# Patient Record
Sex: Male | Born: 1950 | Race: White | Hispanic: No | Marital: Married | Smoking: Never smoker
Health system: Southern US, Community
[De-identification: ages and names within clinical notes are randomized; demographics above are authoritative.]

## PROBLEM LIST (undated history)

## (undated) DIAGNOSIS — M069 Rheumatoid arthritis, unspecified: Secondary | ICD-10-CM

## (undated) DIAGNOSIS — M1990 Unspecified osteoarthritis, unspecified site: Secondary | ICD-10-CM

## (undated) DIAGNOSIS — M109 Gout, unspecified: Secondary | ICD-10-CM

## (undated) DIAGNOSIS — E119 Type 2 diabetes mellitus without complications: Secondary | ICD-10-CM

## (undated) DIAGNOSIS — Z8669 Personal history of other diseases of the nervous system and sense organs: Secondary | ICD-10-CM

## (undated) DIAGNOSIS — I10 Essential (primary) hypertension: Secondary | ICD-10-CM

## (undated) HISTORY — DX: Gout, unspecified: M10.9

## (undated) HISTORY — DX: Type 2 diabetes mellitus without complications: E11.9

## (undated) HISTORY — PX: THORACOSCOPY W/ THORACIC SYMPATHECTOMY: SUR1348

## (undated) HISTORY — DX: Rheumatoid arthritis, unspecified: M06.9

## (undated) HISTORY — DX: Personal history of other diseases of the nervous system and sense organs: Z86.69

## (undated) HISTORY — PX: HERNIA REPAIR: SHX51

---

## 1975-07-25 HISTORY — PX: WRIST SURGERY: SHX841

## 2002-07-24 DIAGNOSIS — Z8669 Personal history of other diseases of the nervous system and sense organs: Secondary | ICD-10-CM

## 2002-07-24 HISTORY — PX: OTHER SURGICAL HISTORY: SHX169

## 2002-07-24 HISTORY — DX: Personal history of other diseases of the nervous system and sense organs: Z86.69

## 2008-10-22 ENCOUNTER — Encounter: Admission: RE | Admit: 2008-10-22 | Discharge: 2008-10-22 | Payer: Self-pay | Admitting: Internal Medicine

## 2014-06-03 DIAGNOSIS — M0589 Other rheumatoid arthritis with rheumatoid factor of multiple sites: Secondary | ICD-10-CM | POA: Diagnosis not present

## 2014-06-15 DIAGNOSIS — M0589 Other rheumatoid arthritis with rheumatoid factor of multiple sites: Secondary | ICD-10-CM | POA: Diagnosis not present

## 2014-07-30 DIAGNOSIS — M0589 Other rheumatoid arthritis with rheumatoid factor of multiple sites: Secondary | ICD-10-CM | POA: Diagnosis not present

## 2014-08-18 DIAGNOSIS — M5136 Other intervertebral disc degeneration, lumbar region: Secondary | ICD-10-CM | POA: Diagnosis not present

## 2014-08-18 DIAGNOSIS — M0589 Other rheumatoid arthritis with rheumatoid factor of multiple sites: Secondary | ICD-10-CM | POA: Diagnosis not present

## 2014-08-18 DIAGNOSIS — M1A09X0 Idiopathic chronic gout, multiple sites, without tophus (tophi): Secondary | ICD-10-CM | POA: Diagnosis not present

## 2014-09-21 DIAGNOSIS — R972 Elevated prostate specific antigen [PSA]: Secondary | ICD-10-CM | POA: Diagnosis not present

## 2014-09-21 DIAGNOSIS — I10 Essential (primary) hypertension: Secondary | ICD-10-CM | POA: Diagnosis not present

## 2014-09-21 DIAGNOSIS — R739 Hyperglycemia, unspecified: Secondary | ICD-10-CM | POA: Diagnosis not present

## 2014-09-21 DIAGNOSIS — G44219 Episodic tension-type headache, not intractable: Secondary | ICD-10-CM | POA: Diagnosis not present

## 2014-09-24 DIAGNOSIS — M0589 Other rheumatoid arthritis with rheumatoid factor of multiple sites: Secondary | ICD-10-CM | POA: Diagnosis not present

## 2014-11-19 DIAGNOSIS — M0589 Other rheumatoid arthritis with rheumatoid factor of multiple sites: Secondary | ICD-10-CM | POA: Diagnosis not present

## 2014-12-15 DIAGNOSIS — N401 Enlarged prostate with lower urinary tract symptoms: Secondary | ICD-10-CM | POA: Diagnosis not present

## 2014-12-15 DIAGNOSIS — R972 Elevated prostate specific antigen [PSA]: Secondary | ICD-10-CM | POA: Diagnosis not present

## 2014-12-15 DIAGNOSIS — R351 Nocturia: Secondary | ICD-10-CM | POA: Diagnosis not present

## 2014-12-15 DIAGNOSIS — N138 Other obstructive and reflux uropathy: Secondary | ICD-10-CM | POA: Diagnosis not present

## 2014-12-17 DIAGNOSIS — M0589 Other rheumatoid arthritis with rheumatoid factor of multiple sites: Secondary | ICD-10-CM | POA: Diagnosis not present

## 2014-12-17 DIAGNOSIS — M1A09X0 Idiopathic chronic gout, multiple sites, without tophus (tophi): Secondary | ICD-10-CM | POA: Diagnosis not present

## 2014-12-17 DIAGNOSIS — Z79899 Other long term (current) drug therapy: Secondary | ICD-10-CM | POA: Diagnosis not present

## 2014-12-17 DIAGNOSIS — M5136 Other intervertebral disc degeneration, lumbar region: Secondary | ICD-10-CM | POA: Diagnosis not present

## 2015-01-14 DIAGNOSIS — M0589 Other rheumatoid arthritis with rheumatoid factor of multiple sites: Secondary | ICD-10-CM | POA: Diagnosis not present

## 2015-03-11 DIAGNOSIS — I10 Essential (primary) hypertension: Secondary | ICD-10-CM | POA: Diagnosis not present

## 2015-03-11 DIAGNOSIS — Z125 Encounter for screening for malignant neoplasm of prostate: Secondary | ICD-10-CM | POA: Diagnosis not present

## 2015-03-11 DIAGNOSIS — M0589 Other rheumatoid arthritis with rheumatoid factor of multiple sites: Secondary | ICD-10-CM | POA: Diagnosis not present

## 2015-03-16 DIAGNOSIS — M25562 Pain in left knee: Secondary | ICD-10-CM | POA: Diagnosis not present

## 2015-03-16 DIAGNOSIS — M8560 Other cyst of bone, unspecified site: Secondary | ICD-10-CM | POA: Diagnosis not present

## 2015-03-23 DIAGNOSIS — M5136 Other intervertebral disc degeneration, lumbar region: Secondary | ICD-10-CM | POA: Diagnosis not present

## 2015-03-23 DIAGNOSIS — Z79899 Other long term (current) drug therapy: Secondary | ICD-10-CM | POA: Diagnosis not present

## 2015-03-23 DIAGNOSIS — M150 Primary generalized (osteo)arthritis: Secondary | ICD-10-CM | POA: Diagnosis not present

## 2015-03-23 DIAGNOSIS — M0589 Other rheumatoid arthritis with rheumatoid factor of multiple sites: Secondary | ICD-10-CM | POA: Diagnosis not present

## 2015-03-23 DIAGNOSIS — M1A09X0 Idiopathic chronic gout, multiple sites, without tophus (tophi): Secondary | ICD-10-CM | POA: Diagnosis not present

## 2015-03-31 DIAGNOSIS — R972 Elevated prostate specific antigen [PSA]: Secondary | ICD-10-CM | POA: Diagnosis not present

## 2015-03-31 DIAGNOSIS — Z0000 Encounter for general adult medical examination without abnormal findings: Secondary | ICD-10-CM | POA: Diagnosis not present

## 2015-03-31 DIAGNOSIS — Z23 Encounter for immunization: Secondary | ICD-10-CM | POA: Diagnosis not present

## 2015-03-31 DIAGNOSIS — I10 Essential (primary) hypertension: Secondary | ICD-10-CM | POA: Diagnosis not present

## 2015-03-31 DIAGNOSIS — R7309 Other abnormal glucose: Secondary | ICD-10-CM | POA: Diagnosis not present

## 2015-04-06 DIAGNOSIS — M1712 Unilateral primary osteoarthritis, left knee: Secondary | ICD-10-CM | POA: Diagnosis not present

## 2015-05-06 DIAGNOSIS — M0589 Other rheumatoid arthritis with rheumatoid factor of multiple sites: Secondary | ICD-10-CM | POA: Diagnosis not present

## 2015-06-23 DIAGNOSIS — M150 Primary generalized (osteo)arthritis: Secondary | ICD-10-CM | POA: Diagnosis not present

## 2015-06-23 DIAGNOSIS — M5136 Other intervertebral disc degeneration, lumbar region: Secondary | ICD-10-CM | POA: Diagnosis not present

## 2015-06-23 DIAGNOSIS — M1A09X0 Idiopathic chronic gout, multiple sites, without tophus (tophi): Secondary | ICD-10-CM | POA: Diagnosis not present

## 2015-06-23 DIAGNOSIS — M0589 Other rheumatoid arthritis with rheumatoid factor of multiple sites: Secondary | ICD-10-CM | POA: Diagnosis not present

## 2015-06-23 DIAGNOSIS — Z79899 Other long term (current) drug therapy: Secondary | ICD-10-CM | POA: Diagnosis not present

## 2015-07-01 DIAGNOSIS — M0589 Other rheumatoid arthritis with rheumatoid factor of multiple sites: Secondary | ICD-10-CM | POA: Diagnosis not present

## 2015-07-07 DIAGNOSIS — M25561 Pain in right knee: Secondary | ICD-10-CM | POA: Diagnosis not present

## 2015-07-07 DIAGNOSIS — D219 Benign neoplasm of connective and other soft tissue, unspecified: Secondary | ICD-10-CM | POA: Diagnosis not present

## 2015-07-12 DIAGNOSIS — Z79899 Other long term (current) drug therapy: Secondary | ICD-10-CM | POA: Diagnosis not present

## 2015-07-12 DIAGNOSIS — M5136 Other intervertebral disc degeneration, lumbar region: Secondary | ICD-10-CM | POA: Diagnosis not present

## 2015-07-12 DIAGNOSIS — M150 Primary generalized (osteo)arthritis: Secondary | ICD-10-CM | POA: Diagnosis not present

## 2015-07-12 DIAGNOSIS — M1A09X0 Idiopathic chronic gout, multiple sites, without tophus (tophi): Secondary | ICD-10-CM | POA: Diagnosis not present

## 2015-07-12 DIAGNOSIS — M0589 Other rheumatoid arthritis with rheumatoid factor of multiple sites: Secondary | ICD-10-CM | POA: Diagnosis not present

## 2015-07-19 ENCOUNTER — Emergency Department (HOSPITAL_COMMUNITY): Payer: Medicare Other

## 2015-07-19 ENCOUNTER — Emergency Department (HOSPITAL_COMMUNITY)
Admission: EM | Admit: 2015-07-19 | Discharge: 2015-07-19 | Disposition: A | Discharge status: Home / Self Care | Payer: Medicare Other | Attending: Emergency Medicine | Admitting: Emergency Medicine

## 2015-07-19 ENCOUNTER — Encounter (HOSPITAL_COMMUNITY): Payer: Self-pay | Admitting: *Deleted

## 2015-07-19 DIAGNOSIS — R590 Localized enlarged lymph nodes: Secondary | ICD-10-CM

## 2015-07-19 DIAGNOSIS — A154 Tuberculosis of intrathoracic lymph nodes: Secondary | ICD-10-CM | POA: Insufficient documentation

## 2015-07-19 DIAGNOSIS — T7840XA Allergy, unspecified, initial encounter: Secondary | ICD-10-CM | POA: Diagnosis not present

## 2015-07-19 DIAGNOSIS — I10 Essential (primary) hypertension: Secondary | ICD-10-CM | POA: Diagnosis not present

## 2015-07-19 DIAGNOSIS — R599 Enlarged lymph nodes, unspecified: Secondary | ICD-10-CM | POA: Diagnosis not present

## 2015-07-19 DIAGNOSIS — M1990 Unspecified osteoarthritis, unspecified site: Secondary | ICD-10-CM | POA: Diagnosis not present

## 2015-07-19 DIAGNOSIS — R079 Chest pain, unspecified: Secondary | ICD-10-CM | POA: Diagnosis not present

## 2015-07-19 HISTORY — DX: Unspecified osteoarthritis, unspecified site: M19.90

## 2015-07-19 HISTORY — DX: Essential (primary) hypertension: I10

## 2015-07-19 LAB — CBC WITH DIFFERENTIAL/PLATELET
BASOS PCT: 0 %
Basophils Absolute: 0 10*3/uL (ref 0.0–0.1)
Eosinophils Absolute: 0.1 10*3/uL (ref 0.0–0.7)
Eosinophils Relative: 1 %
HEMATOCRIT: 43.7 % (ref 39.0–52.0)
Hemoglobin: 14.7 g/dL (ref 13.0–17.0)
LYMPHS ABS: 2.2 10*3/uL (ref 0.7–4.0)
LYMPHS PCT: 27 %
MCH: 30.2 pg (ref 26.0–34.0)
MCHC: 33.6 g/dL (ref 30.0–36.0)
MCV: 89.7 fL (ref 78.0–100.0)
MONO ABS: 1 10*3/uL (ref 0.1–1.0)
MONOS PCT: 13 %
NEUTROS ABS: 4.7 10*3/uL (ref 1.7–7.7)
Neutrophils Relative %: 59 %
Platelets: 110 10*3/uL — ABNORMAL LOW (ref 150–400)
RBC: 4.87 MIL/uL (ref 4.22–5.81)
RDW: 14.5 % (ref 11.5–15.5)
WBC: 8 10*3/uL (ref 4.0–10.5)

## 2015-07-19 LAB — BASIC METABOLIC PANEL
ANION GAP: 9 (ref 5–15)
BUN: 17 mg/dL (ref 6–20)
CALCIUM: 9 mg/dL (ref 8.9–10.3)
CO2: 25 mmol/L (ref 22–32)
Chloride: 105 mmol/L (ref 101–111)
Creatinine, Ser: 0.98 mg/dL (ref 0.61–1.24)
GFR calc Af Amer: >60 mL/min (ref >60)
GFR calc non Af Amer: >60 mL/min (ref >60)
GLUCOSE: 153 mg/dL — AB (ref 65–99)
Potassium: 3.7 mmol/L (ref 3.5–5.1)
Sodium: 139 mmol/L (ref 135–145)

## 2015-07-19 LAB — TROPONIN I: Troponin I: <0.03 ng/mL (ref <0.031)

## 2015-07-19 MED ORDER — DIPHENHYDRAMINE HCL 25 MG PO CAPS
25.0000 mg | ORAL_CAPSULE | Freq: Once | ORAL | Status: AC
  Administered 2015-07-19: 25 mg via ORAL
  Filled 2015-07-19: qty 1

## 2015-07-19 MED ORDER — DIPHENHYDRAMINE HCL 50 MG/ML IJ SOLN: 25.0000 mg | Freq: Once | INTRAMUSCULAR | Status: DC

## 2015-07-19 NOTE — ED Notes (Signed)
Patient has returned from being out of the department; patient placed back on monitor, continuous pulse oximetry and blood pressure cuff; visitor at bedside 

## 2015-07-19 NOTE — ED Notes (Signed)
Patient d/c'd from monitor, continuous pulse oximetry and blood pressure cuff; patient getting dressed to be discharged home; visitor at bedside 

## 2015-07-19 NOTE — ED Notes (Signed)
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 Pt states that that he woke this morning with swelling to his lips and rash to his back. Pt able to speak in full sentences. Reports "congestion" in his throat as well.

## 2015-07-19 NOTE — Discharge Instructions (Signed)
Return as discussed for problems breathing or swallowing. Call the ambulance as discussed.you need to have a repeat x-ray in the 2 weeks as discussed. Continue to take benadryl as discussed  Allergies An allergy is when your body reacts to a substance in a way that is not normal. An allergic reaction can happen after you:  Eat something.  Breathe in something.  Touch something. WHAT KINDS OF ALLERGIES ARE THERE? You can be allergic to:  Things that are only around during certain seasons, like molds and pollens.  Foods.  Drugs.  Insects.  Animal dander. WHAT ARE SYMPTOMS OF ALLERGIES?  Puffiness (swelling). This may happen on the lips, face, tongue, mouth, or throat.  Sneezing.  Coughing.  Breathing loudly (wheezing).  Stuffy nose.  Tingling in the mouth.  A rash.  Itching.  Itchy, red, puffy areas of skin (hives).  Watery eyes.  Throwing up (vomiting).  Watery poop (diarrhea).  Dizziness.  Feeling faint or fainting.  Trouble breathing or swallowing.  A tight feeling in the chest.  A fast heartbeat. HOW ARE ALLERGIES DIAGNOSED? Allergies can be diagnosed with:  A medical and family history.  Skin tests.  Blood tests.  A food diary. A food diary is a record of all the foods, drinks, and symptoms you have each day.  The results of an elimination diet. This diet involves making sure not to eat certain foods and then seeing what happens when you start eating them again. HOW ARE ALLERGIES TREATED? There is no cure for allergies, but allergic reactions can be treated with medicine. Severe reactions usually need to be treated at a hospital.  HOW CAN REACTIONS BE PREVENTED? The best way to prevent an allergic reaction is to avoid the thing you are allergic to. Allergy shots and medicines can also help prevent reactions in some cases.   This information is not intended to replace advice given to you by your health care provider. Make sure you discuss  any questions you have with your health care provider.   Document Released: 11/04/2012 Document Revised: 07/31/2014 Document Reviewed: 04/21/2014 Elsevier Interactive Patient Education 2016 [employer] Lymphadenopathy Lymphadenopathy refers to swollen or enlarged lymph glands, also called lymph nodes. Lymph glands are part of your body's defense (immune) system, which protects the body from infections, germs, and diseases. Lymph glands are found in many locations in your body, including the neck, underarm, and groin.  Many things can cause lymph glands to become enlarged. When your immune system responds to germs, such as viruses or bacteria, infection-fighting cells and fluid build up. This causes the glands to grow in size. Usually, this is not something to worry about. The swelling and any soreness often go away without treatment. However, swollen lymph glands can also be caused by a number of diseases. Your health care provider may do various tests to help determine the cause. If the cause of your swollen lymph glands cannot be found, it is important to monitor your condition to make sure the swelling goes away. HOME CARE INSTRUCTIONS Watch your condition for any changes. The following actions may help to lessen any discomfort you are feeling:  Get plenty of rest.  Take medicines only as directed by your health care provider. Your health care provider may recommend over-the-counter medicines for pain.  Apply moist heat compresses to the site of swollen lymph nodes as directed by your health care provider. This can help reduce any pain.  Check your lymph nodes daily for any changes.  Keep all follow-up visits as directed by your health care provider. This is important. SEEK MEDICAL CARE IF:  Your lymph nodes are still swollen after 2 weeks.  Your swelling increases or spreads to other areas.  Your lymph nodes are hard, seem fixed to the skin, or are growing rapidly.  Your skin over  the lymph nodes is red and inflamed.  You have a fever.  You have chills.  You have fatigue.  You develop a sore throat.  You have abdominal pain.  You have weight loss.  You have night sweats. SEEK IMMEDIATE MEDICAL CARE IF:  You notice fluid leaking from the area of the enlarged lymph node.  You have severe pain in any area of your body.  You have chest pain.  You have shortness of breath.   This information is not intended to replace advice given to you by your health care provider. Make sure you discuss any questions you have with your health care provider.   Document Released: 04/18/2008 Document Revised: 07/31/2014 Document Reviewed: 02/12/2014 Elsevier Interactive Patient Education [employer].

## 2015-07-19 NOTE — ED Notes (Signed)
MD at bedside. 

## 2015-07-19 NOTE — ED Provider Notes (Signed)
CSN: [id number]     Arrival date & time 07/19/15  [id number] History   First MD Initiated Contact with Patient 07/19/15 [phone]     Chief Complaint  Patient presents with  . Allergic Reaction     (Consider location/radiation/quality/duration/timing/severity/associated sxs/prior Treatment) HPI Comments: Pt comes in with c/o allergic reaction. He states that he had remicade in October and has been having intermittent hives since. She states that he woke up this morning with hives on his back and his left lower lip was swollen and he felt congested in the back of his throat. He denies taking anything for th symptoms and the hives and facial swelling have resolved on its own. He state that he is on a prednisone pack for the symptoms that he has been having. He states that he is having left sided chest pain and some nausea with the symptoms. Denies sob. He had been taking benadryl but stopped 2 days ago because the itching had gotten a lot better  The history is provided by the patient. No language interpreter was used.    Past Medical History  Diagnosis Date  . Arthritis   . Hypertension    History reviewed. No pertinent past surgical history. No family history on file. Social History  Substance Use Topics  . Smoking status: Never Smoker   . Smokeless tobacco: None  . Alcohol Use: None    Review of Systems  All other systems reviewed and are negative.     Allergies  Review of patient's allergies indicates no known allergies.  Home Medications   Prior to Admission medications   Not on File   BP 158/90 mmHg  Pulse 70  Temp(Src) 97.7 F (36.5 C) (Oral)  Resp 16  SpO2 98% Physical Exam  Constitutional: He is oriented to person, place, and time. He appears well-developed and well-nourished.  HENT:  Head: Normocephalic.  Mouth/Throat: Oropharynx is clear and moist.  No facial swelling noted. No oral mucosal swelling  Eyes: Conjunctivae are normal. Pupils are equal, round, and  reactive to light.  Cardiovascular: Normal rate and regular rhythm.   Pulmonary/Chest: Effort normal and breath sounds normal.  Musculoskeletal: Normal range of motion.  Neurological: He is alert and oriented to person, place, and time. He exhibits normal muscle tone. Coordination normal.  Skin: Skin is warm and dry. No rash noted.  Psychiatric: He has a normal mood and affect.  Nursing note and vitals reviewed.   ED Course  Procedures (including critical care time) Labs Review Labs Reviewed  BASIC METABOLIC PANEL - Abnormal; Notable for the following:    Glucose, Bld 153 (*)    All other components within normal limits  CBC WITH DIFFERENTIAL/PLATELET - Abnormal; Notable for the following:    Platelets 110 (*)    All other components within normal limits  TROPONIN I    Imaging Review Dg Chest 2 View  07/19/2015  CLINICAL DATA:  Chest pain EXAM: CHEST - 2 VIEW COMPARISON:  None. FINDINGS: The heart size is normal. There is mild prominence of the hila which could represent adenopathy. There is no edema or effusion. No focal airspace disease is evident. The visualized soft tissues and bony thorax are unremarkable. IMPRESSION: 1. Mild hilar prominence, potentially adenopathy. 2. Otherwise no acute cardiopulmonary disease. Electronically Signed   By: San Morelle M.D.   On: 07/19/2015 09:39   I have personally reviewed and evaluated these images and lab results as part of my medical decision-making.   EKG Interpretation  Date/Time:  Monday July 19 2015 08:29:55 EST Ventricular Rate:  55 PR Interval:  137 QRS Duration: 101 QT Interval:  410 QTC Calculation: 392 R Axis:   10 Text Interpretation:  Sinus rhythm No previous tracing `no acute st/t  changes Confirmed by Ashok Cordia  MD, Lennette Bihari ([id number]) on 07/19/2015 10:20:10 AM      MDM   Final diagnoses:  Allergic reaction, initial encounter  Adenopathy, hilar    9:20 AM Pharmacy clarified that pts infusion was 12/8  not in october  10:34 AM Pt continues to not have any oral mucosal swelling or respiratory distress. Discussed strict return precaution including calling ems for oral involvement. Pt is on steroids already. Discussed follow up with his pcp for the x-ray  Glendell Docker, NP 07/19/15 [place], MD 07/19/15 1220

## 2015-07-21 DIAGNOSIS — M0589 Other rheumatoid arthritis with rheumatoid factor of multiple sites: Secondary | ICD-10-CM | POA: Diagnosis not present

## 2015-07-21 DIAGNOSIS — M1A09X0 Idiopathic chronic gout, multiple sites, without tophus (tophi): Secondary | ICD-10-CM | POA: Diagnosis not present

## 2015-07-21 DIAGNOSIS — Z79899 Other long term (current) drug therapy: Secondary | ICD-10-CM | POA: Diagnosis not present

## 2015-07-21 DIAGNOSIS — K219 Gastro-esophageal reflux disease without esophagitis: Secondary | ICD-10-CM | POA: Diagnosis not present

## 2015-07-21 DIAGNOSIS — M150 Primary generalized (osteo)arthritis: Secondary | ICD-10-CM | POA: Diagnosis not present

## 2015-07-21 DIAGNOSIS — L509 Urticaria, unspecified: Secondary | ICD-10-CM | POA: Diagnosis not present

## 2015-07-21 DIAGNOSIS — M5136 Other intervertebral disc degeneration, lumbar region: Secondary | ICD-10-CM | POA: Diagnosis not present

## 2015-08-04 DIAGNOSIS — M5136 Other intervertebral disc degeneration, lumbar region: Secondary | ICD-10-CM | POA: Diagnosis not present

## 2015-08-04 DIAGNOSIS — L509 Urticaria, unspecified: Secondary | ICD-10-CM | POA: Diagnosis not present

## 2015-08-04 DIAGNOSIS — Z79899 Other long term (current) drug therapy: Secondary | ICD-10-CM | POA: Diagnosis not present

## 2015-08-04 DIAGNOSIS — M1A09X0 Idiopathic chronic gout, multiple sites, without tophus (tophi): Secondary | ICD-10-CM | POA: Diagnosis not present

## 2015-08-04 DIAGNOSIS — M0589 Other rheumatoid arthritis with rheumatoid factor of multiple sites: Secondary | ICD-10-CM | POA: Diagnosis not present

## 2015-08-04 DIAGNOSIS — K219 Gastro-esophageal reflux disease without esophagitis: Secondary | ICD-10-CM | POA: Diagnosis not present

## 2015-08-04 DIAGNOSIS — M150 Primary generalized (osteo)arthritis: Secondary | ICD-10-CM | POA: Diagnosis not present

## 2015-08-12 DIAGNOSIS — M0589 Other rheumatoid arthritis with rheumatoid factor of multiple sites: Secondary | ICD-10-CM | POA: Diagnosis not present

## 2015-08-19 DIAGNOSIS — M1712 Unilateral primary osteoarthritis, left knee: Secondary | ICD-10-CM | POA: Diagnosis not present

## 2015-09-09 DIAGNOSIS — M0589 Other rheumatoid arthritis with rheumatoid factor of multiple sites: Secondary | ICD-10-CM | POA: Diagnosis not present

## 2015-09-17 DIAGNOSIS — M25551 Pain in right hip: Secondary | ICD-10-CM | POA: Diagnosis not present

## 2015-09-23 DIAGNOSIS — H2513 Age-related nuclear cataract, bilateral: Secondary | ICD-10-CM | POA: Diagnosis not present

## 2015-09-30 DIAGNOSIS — M0589 Other rheumatoid arthritis with rheumatoid factor of multiple sites: Secondary | ICD-10-CM | POA: Diagnosis not present

## 2015-09-30 DIAGNOSIS — M150 Primary generalized (osteo)arthritis: Secondary | ICD-10-CM | POA: Diagnosis not present

## 2015-09-30 DIAGNOSIS — M5136 Other intervertebral disc degeneration, lumbar region: Secondary | ICD-10-CM | POA: Diagnosis not present

## 2015-09-30 DIAGNOSIS — Z79899 Other long term (current) drug therapy: Secondary | ICD-10-CM | POA: Diagnosis not present

## 2015-09-30 DIAGNOSIS — M1A09X0 Idiopathic chronic gout, multiple sites, without tophus (tophi): Secondary | ICD-10-CM | POA: Diagnosis not present

## 2015-10-13 DIAGNOSIS — R7309 Other abnormal glucose: Secondary | ICD-10-CM | POA: Diagnosis not present

## 2015-10-13 DIAGNOSIS — I10 Essential (primary) hypertension: Secondary | ICD-10-CM | POA: Diagnosis not present

## 2015-10-19 DIAGNOSIS — R7303 Prediabetes: Secondary | ICD-10-CM | POA: Diagnosis not present

## 2015-10-19 DIAGNOSIS — I10 Essential (primary) hypertension: Secondary | ICD-10-CM | POA: Diagnosis not present

## 2015-10-19 DIAGNOSIS — M1A0790 Idiopathic chronic gout, unspecified ankle and foot, without tophus (tophi): Secondary | ICD-10-CM | POA: Diagnosis not present

## 2015-11-03 DIAGNOSIS — Z79899 Other long term (current) drug therapy: Secondary | ICD-10-CM | POA: Diagnosis not present

## 2015-11-03 DIAGNOSIS — M0589 Other rheumatoid arthritis with rheumatoid factor of multiple sites: Secondary | ICD-10-CM | POA: Diagnosis not present

## 2015-11-28 DIAGNOSIS — I872 Venous insufficiency (chronic) (peripheral): Secondary | ICD-10-CM | POA: Diagnosis not present

## 2015-11-28 DIAGNOSIS — R609 Edema, unspecified: Secondary | ICD-10-CM | POA: Diagnosis not present

## 2015-11-29 ENCOUNTER — Other Ambulatory Visit: Payer: Self-pay | Admitting: Internal Medicine

## 2015-11-29 ENCOUNTER — Ambulatory Visit
Admission: RE | Admit: 2015-11-29 | Discharge: 2015-11-29 | Disposition: A | Discharge status: Home / Self Care | Payer: Medicare Other | Source: Ambulatory Visit | Attending: Internal Medicine | Admitting: Internal Medicine

## 2015-11-29 DIAGNOSIS — R609 Edema, unspecified: Secondary | ICD-10-CM

## 2015-11-29 DIAGNOSIS — M79605 Pain in left leg: Secondary | ICD-10-CM | POA: Diagnosis not present

## 2015-11-29 DIAGNOSIS — M7989 Other specified soft tissue disorders: Secondary | ICD-10-CM | POA: Diagnosis not present

## 2015-11-29 DIAGNOSIS — L03119 Cellulitis of unspecified part of limb: Secondary | ICD-10-CM | POA: Diagnosis not present

## 2015-11-29 DIAGNOSIS — R600 Localized edema: Secondary | ICD-10-CM | POA: Diagnosis not present

## 2015-12-06 DIAGNOSIS — L03119 Cellulitis of unspecified part of limb: Secondary | ICD-10-CM | POA: Diagnosis not present

## 2015-12-29 DIAGNOSIS — M0589 Other rheumatoid arthritis with rheumatoid factor of multiple sites: Secondary | ICD-10-CM | POA: Diagnosis not present

## 2016-01-20 DIAGNOSIS — M150 Primary generalized (osteo)arthritis: Secondary | ICD-10-CM | POA: Diagnosis not present

## 2016-01-20 DIAGNOSIS — M5136 Other intervertebral disc degeneration, lumbar region: Secondary | ICD-10-CM | POA: Diagnosis not present

## 2016-01-20 DIAGNOSIS — M25511 Pain in right shoulder: Secondary | ICD-10-CM | POA: Diagnosis not present

## 2016-01-20 DIAGNOSIS — M1A09X0 Idiopathic chronic gout, multiple sites, without tophus (tophi): Secondary | ICD-10-CM | POA: Diagnosis not present

## 2016-01-20 DIAGNOSIS — M0589 Other rheumatoid arthritis with rheumatoid factor of multiple sites: Secondary | ICD-10-CM | POA: Diagnosis not present

## 2016-01-20 DIAGNOSIS — M25512 Pain in left shoulder: Secondary | ICD-10-CM | POA: Diagnosis not present

## 2016-01-20 DIAGNOSIS — Z79899 Other long term (current) drug therapy: Secondary | ICD-10-CM | POA: Diagnosis not present

## 2016-02-02 DIAGNOSIS — M0589 Other rheumatoid arthritis with rheumatoid factor of multiple sites: Secondary | ICD-10-CM | POA: Diagnosis not present

## 2016-02-16 DIAGNOSIS — M0589 Other rheumatoid arthritis with rheumatoid factor of multiple sites: Secondary | ICD-10-CM | POA: Diagnosis not present

## 2016-02-25 DIAGNOSIS — R972 Elevated prostate specific antigen [PSA]: Secondary | ICD-10-CM | POA: Diagnosis not present

## 2016-02-25 DIAGNOSIS — N401 Enlarged prostate with lower urinary tract symptoms: Secondary | ICD-10-CM | POA: Diagnosis not present

## 2016-02-25 DIAGNOSIS — R351 Nocturia: Secondary | ICD-10-CM | POA: Diagnosis not present

## 2016-03-01 DIAGNOSIS — M0589 Other rheumatoid arthritis with rheumatoid factor of multiple sites: Secondary | ICD-10-CM | POA: Diagnosis not present

## 2016-03-20 ENCOUNTER — Other Ambulatory Visit: Payer: Self-pay

## 2016-03-29 DIAGNOSIS — M0589 Other rheumatoid arthritis with rheumatoid factor of multiple sites: Secondary | ICD-10-CM | POA: Diagnosis not present

## 2016-04-20 DIAGNOSIS — D225 Melanocytic nevi of trunk: Secondary | ICD-10-CM | POA: Diagnosis not present

## 2016-04-20 DIAGNOSIS — Z23 Encounter for immunization: Secondary | ICD-10-CM | POA: Diagnosis not present

## 2016-04-20 DIAGNOSIS — L74510 Primary focal hyperhidrosis, axilla: Secondary | ICD-10-CM | POA: Diagnosis not present

## 2016-04-20 DIAGNOSIS — L570 Actinic keratosis: Secondary | ICD-10-CM | POA: Diagnosis not present

## 2016-04-20 DIAGNOSIS — L821 Other seborrheic keratosis: Secondary | ICD-10-CM | POA: Diagnosis not present

## 2016-04-20 DIAGNOSIS — L814 Other melanin hyperpigmentation: Secondary | ICD-10-CM | POA: Diagnosis not present

## 2016-04-24 DIAGNOSIS — M150 Primary generalized (osteo)arthritis: Secondary | ICD-10-CM | POA: Diagnosis not present

## 2016-04-24 DIAGNOSIS — M5136 Other intervertebral disc degeneration, lumbar region: Secondary | ICD-10-CM | POA: Diagnosis not present

## 2016-04-24 DIAGNOSIS — M25512 Pain in left shoulder: Secondary | ICD-10-CM | POA: Diagnosis not present

## 2016-04-24 DIAGNOSIS — M25511 Pain in right shoulder: Secondary | ICD-10-CM | POA: Diagnosis not present

## 2016-04-24 DIAGNOSIS — M0589 Other rheumatoid arthritis with rheumatoid factor of multiple sites: Secondary | ICD-10-CM | POA: Diagnosis not present

## 2016-04-24 DIAGNOSIS — Z79899 Other long term (current) drug therapy: Secondary | ICD-10-CM | POA: Diagnosis not present

## 2016-04-24 DIAGNOSIS — M1A09X0 Idiopathic chronic gout, multiple sites, without tophus (tophi): Secondary | ICD-10-CM | POA: Diagnosis not present

## 2016-04-26 DIAGNOSIS — M0589 Other rheumatoid arthritis with rheumatoid factor of multiple sites: Secondary | ICD-10-CM | POA: Diagnosis not present

## 2016-05-01 DIAGNOSIS — R7303 Prediabetes: Secondary | ICD-10-CM | POA: Diagnosis not present

## 2016-05-01 DIAGNOSIS — Z125 Encounter for screening for malignant neoplasm of prostate: Secondary | ICD-10-CM | POA: Diagnosis not present

## 2016-05-01 DIAGNOSIS — M1A0790 Idiopathic chronic gout, unspecified ankle and foot, without tophus (tophi): Secondary | ICD-10-CM | POA: Diagnosis not present

## 2016-05-01 DIAGNOSIS — I10 Essential (primary) hypertension: Secondary | ICD-10-CM | POA: Diagnosis not present

## 2016-05-01 DIAGNOSIS — Z0000 Encounter for general adult medical examination without abnormal findings: Secondary | ICD-10-CM | POA: Diagnosis not present

## 2016-05-08 DIAGNOSIS — M069 Rheumatoid arthritis, unspecified: Secondary | ICD-10-CM | POA: Diagnosis not present

## 2016-05-08 DIAGNOSIS — R7309 Other abnormal glucose: Secondary | ICD-10-CM | POA: Diagnosis not present

## 2016-05-08 DIAGNOSIS — Z23 Encounter for immunization: Secondary | ICD-10-CM | POA: Diagnosis not present

## 2016-05-08 DIAGNOSIS — I10 Essential (primary) hypertension: Secondary | ICD-10-CM | POA: Diagnosis not present

## 2016-05-08 DIAGNOSIS — M1A0790 Idiopathic chronic gout, unspecified ankle and foot, without tophus (tophi): Secondary | ICD-10-CM | POA: Diagnosis not present

## 2016-05-18 DIAGNOSIS — L821 Other seborrheic keratosis: Secondary | ICD-10-CM | POA: Diagnosis not present

## 2016-05-18 DIAGNOSIS — L905 Scar conditions and fibrosis of skin: Secondary | ICD-10-CM | POA: Diagnosis not present

## 2016-05-23 IMAGING — DX DG CHEST 2V
2 series · 2 of 2 positions shown · non-contrast
Comparison: None.

CLINICAL DATA: Chest pain

EXAM:
CHEST - 2 VIEW

[chest pa]
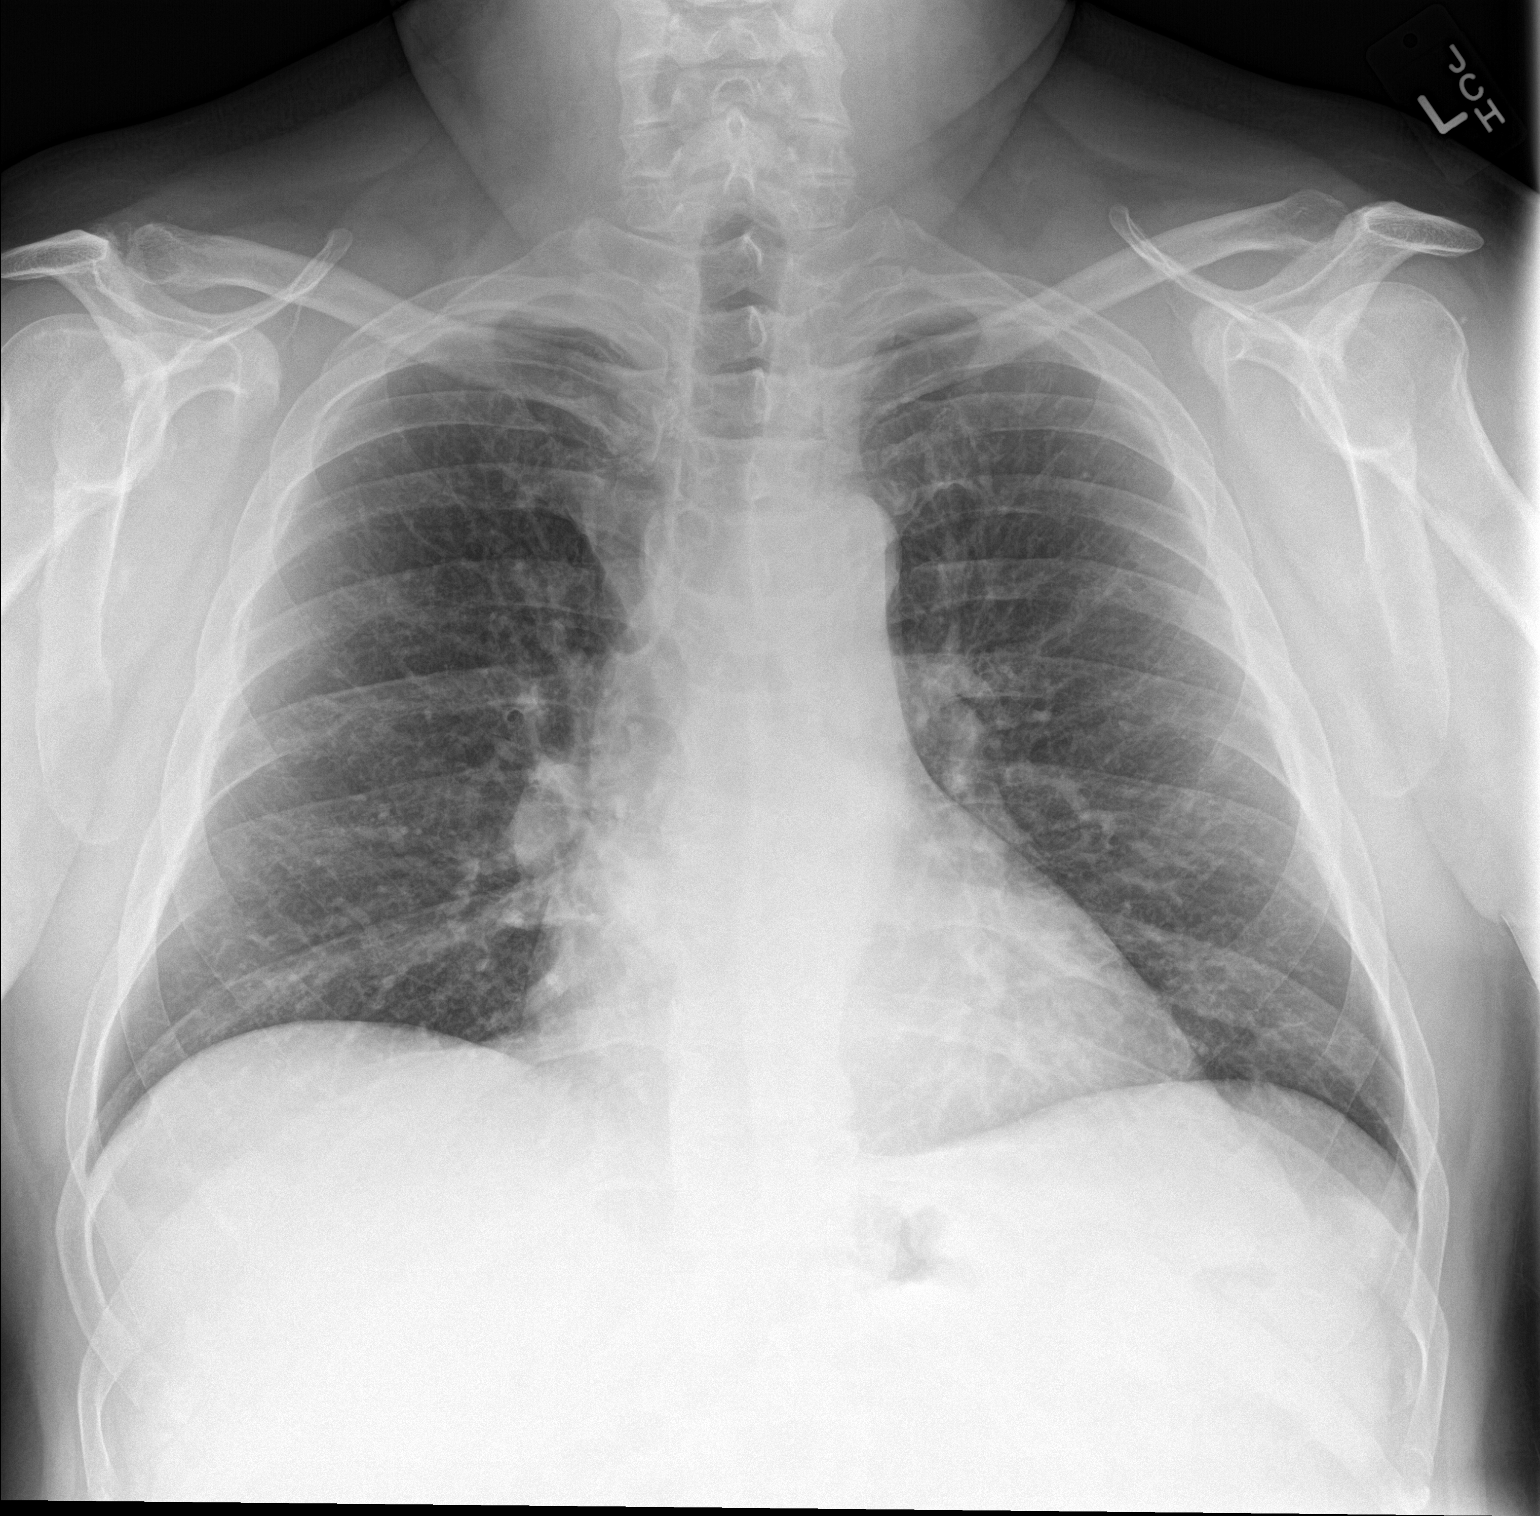

[chest lat]
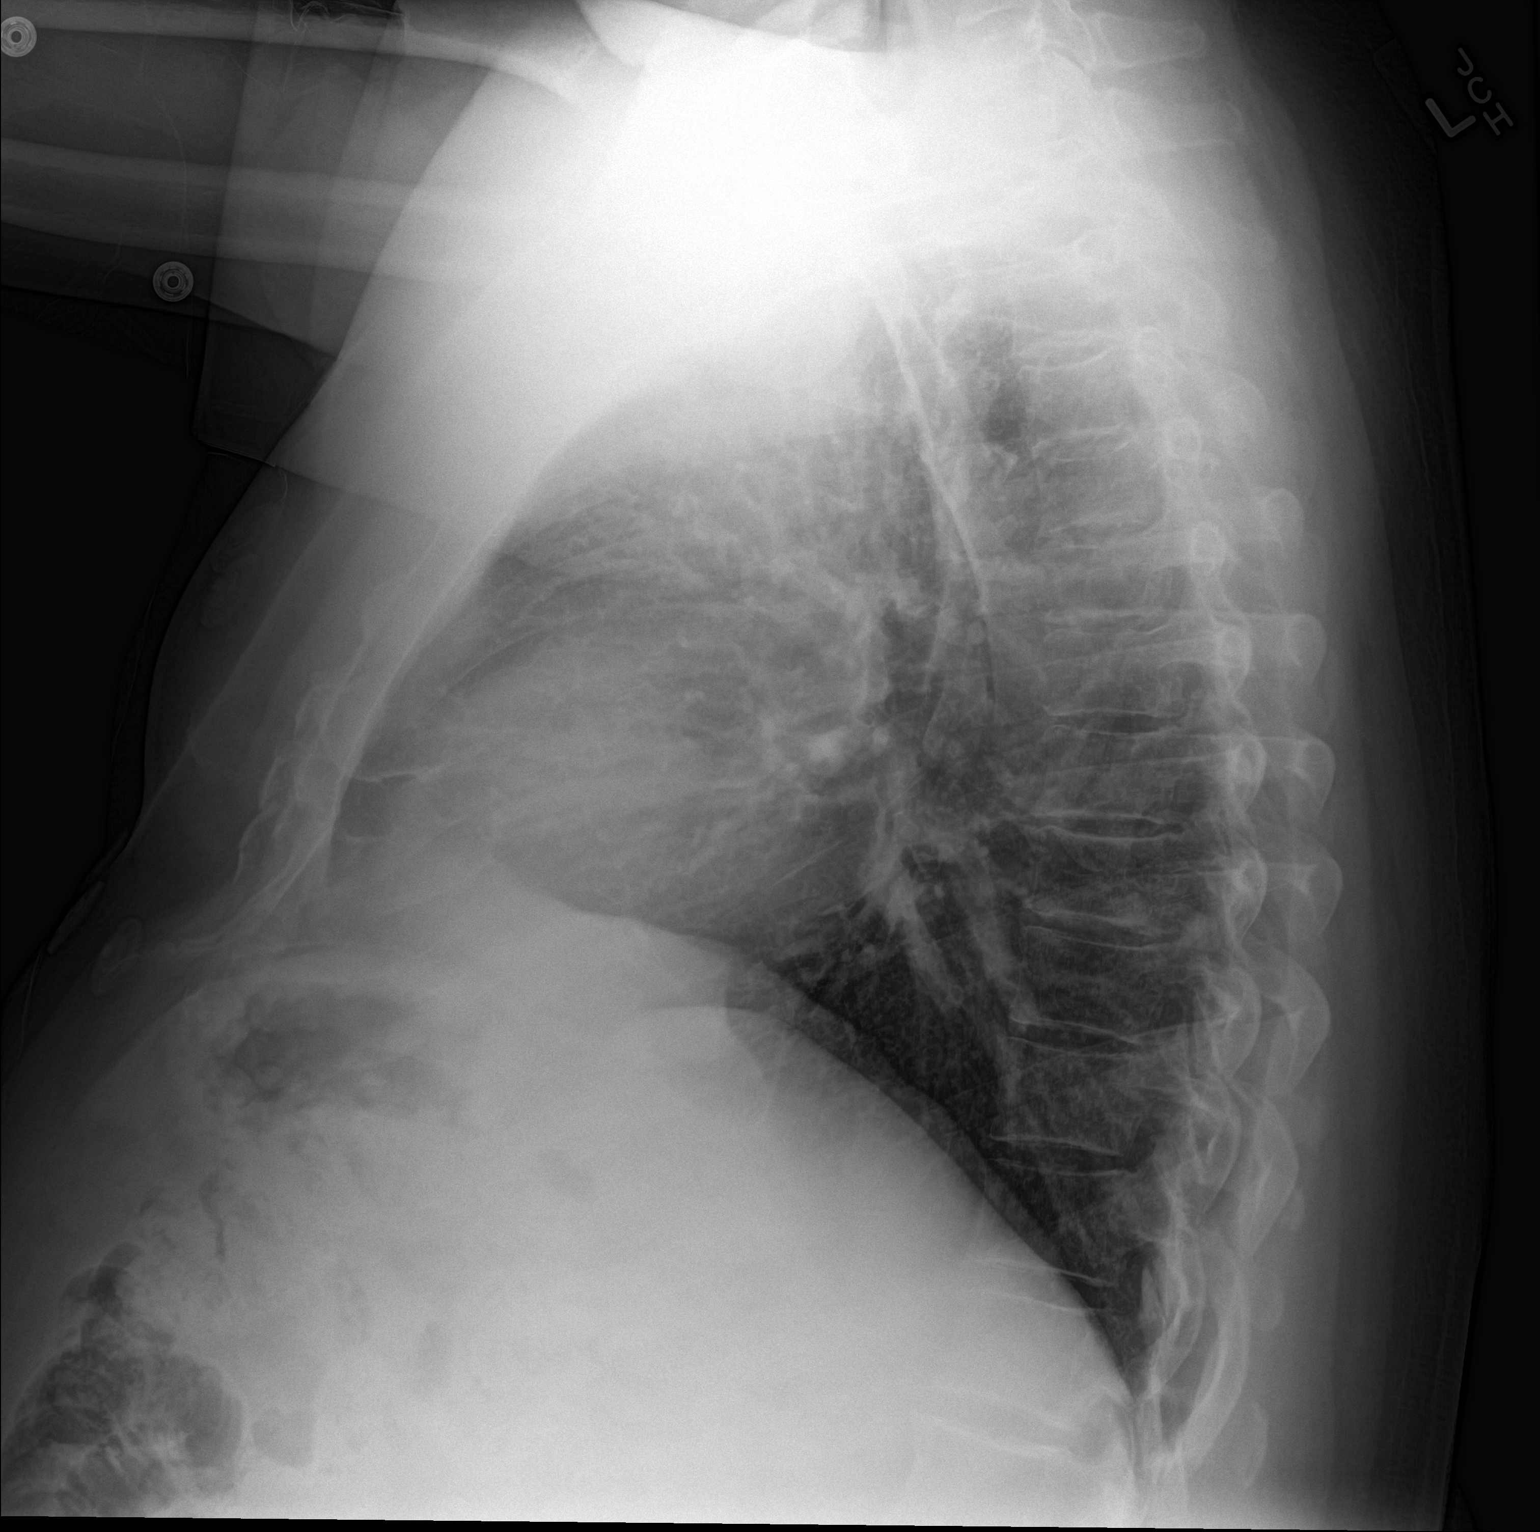

[2 of 2 positions shown; findings below may reference images not displayed]

FINDINGS: The heart size is normal. There is mild prominence of the hila which
could represent adenopathy. There is no edema or effusion. No focal
airspace disease is evident. The visualized soft tissues and bony
thorax are unremarkable.
IMPRESSION: 1. Mild hilar prominence, potentially adenopathy.
2. Otherwise no acute cardiopulmonary disease.

## 2016-05-24 DIAGNOSIS — M0589 Other rheumatoid arthritis with rheumatoid factor of multiple sites: Secondary | ICD-10-CM | POA: Diagnosis not present

## 2016-06-01 DIAGNOSIS — S91109A Unspecified open wound of unspecified toe(s) without damage to nail, initial encounter: Secondary | ICD-10-CM | POA: Diagnosis not present

## 2016-06-01 DIAGNOSIS — L03032 Cellulitis of left toe: Secondary | ICD-10-CM | POA: Diagnosis not present

## 2016-06-05 DIAGNOSIS — H2513 Age-related nuclear cataract, bilateral: Secondary | ICD-10-CM | POA: Diagnosis not present

## 2016-06-05 DIAGNOSIS — R7303 Prediabetes: Secondary | ICD-10-CM | POA: Diagnosis not present

## 2016-06-05 DIAGNOSIS — H25013 Cortical age-related cataract, bilateral: Secondary | ICD-10-CM | POA: Diagnosis not present

## 2016-06-05 DIAGNOSIS — H43811 Vitreous degeneration, right eye: Secondary | ICD-10-CM | POA: Diagnosis not present

## 2016-06-21 DIAGNOSIS — M0589 Other rheumatoid arthritis with rheumatoid factor of multiple sites: Secondary | ICD-10-CM | POA: Diagnosis not present

## 2016-07-19 DIAGNOSIS — Z79899 Other long term (current) drug therapy: Secondary | ICD-10-CM | POA: Diagnosis not present

## 2016-07-19 DIAGNOSIS — M0589 Other rheumatoid arthritis with rheumatoid factor of multiple sites: Secondary | ICD-10-CM | POA: Diagnosis not present

## 2016-08-16 DIAGNOSIS — M0589 Other rheumatoid arthritis with rheumatoid factor of multiple sites: Secondary | ICD-10-CM | POA: Diagnosis not present

## 2016-09-05 DIAGNOSIS — M5136 Other intervertebral disc degeneration, lumbar region: Secondary | ICD-10-CM | POA: Diagnosis not present

## 2016-09-05 DIAGNOSIS — E669 Obesity, unspecified: Secondary | ICD-10-CM | POA: Diagnosis not present

## 2016-09-05 DIAGNOSIS — M25511 Pain in right shoulder: Secondary | ICD-10-CM | POA: Diagnosis not present

## 2016-09-05 DIAGNOSIS — M1A09X0 Idiopathic chronic gout, multiple sites, without tophus (tophi): Secondary | ICD-10-CM | POA: Diagnosis not present

## 2016-09-05 DIAGNOSIS — M25512 Pain in left shoulder: Secondary | ICD-10-CM | POA: Diagnosis not present

## 2016-09-05 DIAGNOSIS — Z6839 Body mass index (BMI) 39.0-39.9, adult: Secondary | ICD-10-CM | POA: Diagnosis not present

## 2016-09-05 DIAGNOSIS — M150 Primary generalized (osteo)arthritis: Secondary | ICD-10-CM | POA: Diagnosis not present

## 2016-09-05 DIAGNOSIS — Z79899 Other long term (current) drug therapy: Secondary | ICD-10-CM | POA: Diagnosis not present

## 2016-09-05 DIAGNOSIS — M0589 Other rheumatoid arthritis with rheumatoid factor of multiple sites: Secondary | ICD-10-CM | POA: Diagnosis not present

## 2016-10-03 IMAGING — US US EXTREM LOW VENOUS*L*
1 series · 14 of 24 positions shown · non-contrast
Comparison: None

CLINICAL DATA: Left leg swelling, pain, redness

EXAM:
LEFT LOWER EXTREMITY VENOUS DOPPLER ULTRASOUND
TECHNIQUE: Gray-scale sonography with compression, as well as color and duplex
ultrasound, were performed to evaluate the deep venous system from
the level of the common femoral vein through the popliteal and
proximal calf veins.

[Series 1: us extrem low venous*left* · 14 of 35 slices shown]
[im 1/35]
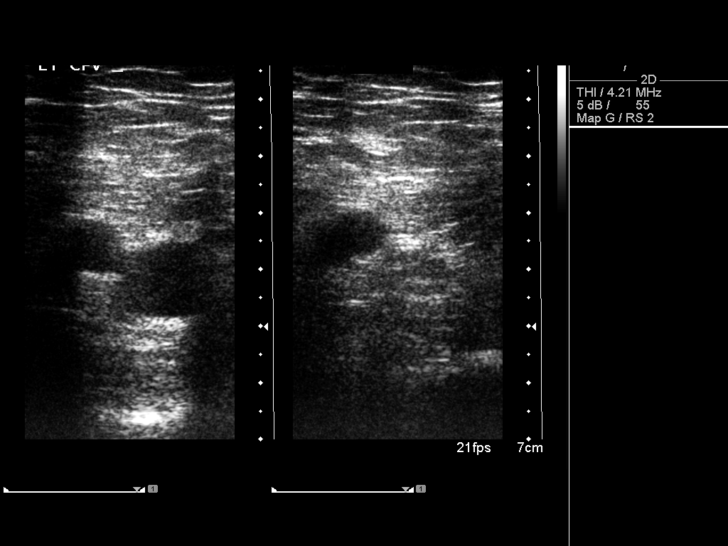
[im 3/35]
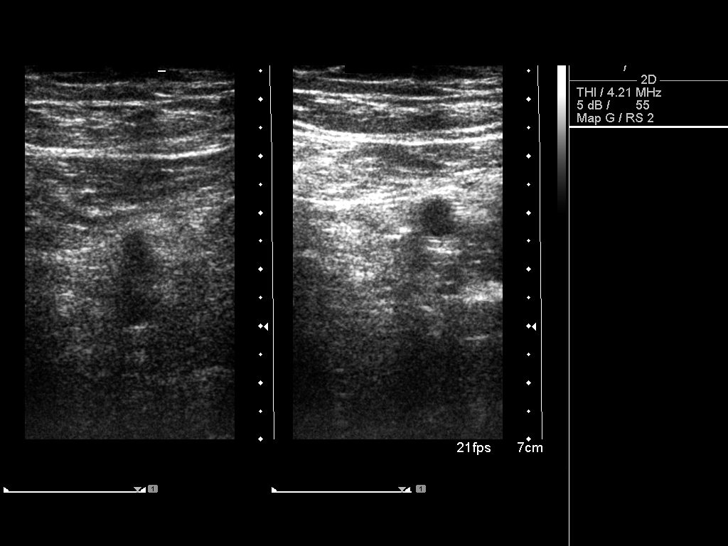
[im 6/35]
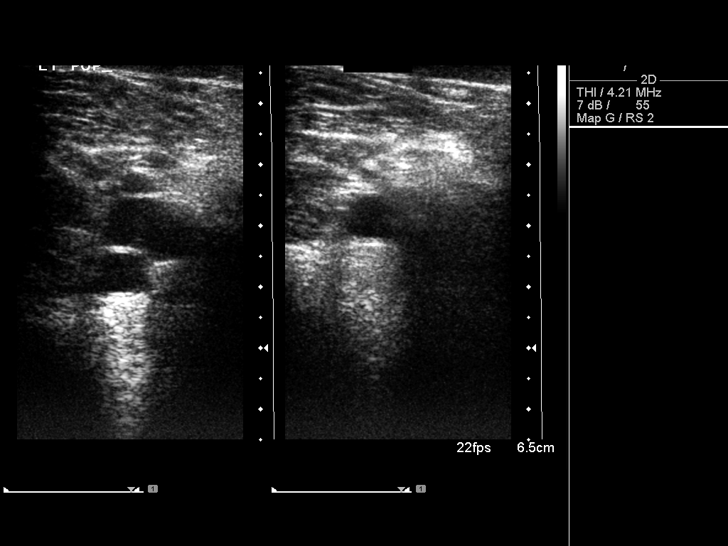
[im 9/35]
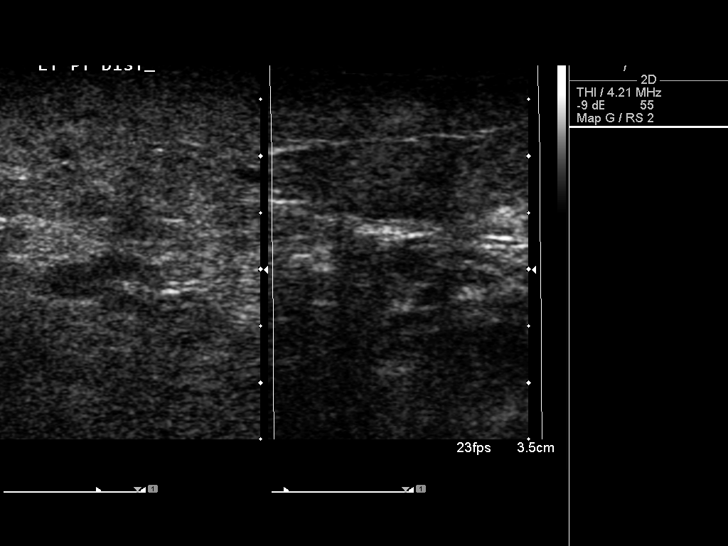
[im 11/35]
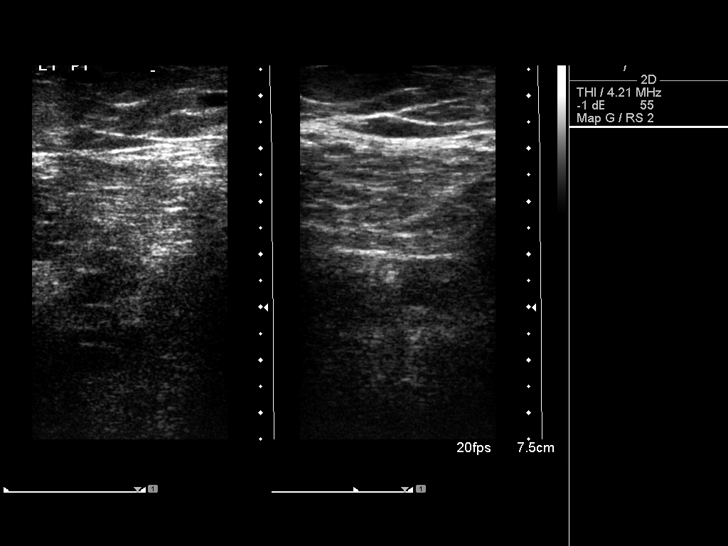
[im 14/35]
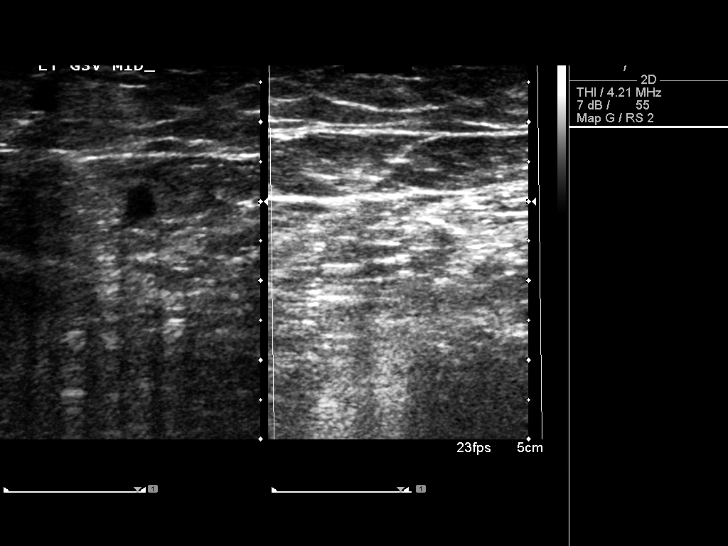
[im 17/35]
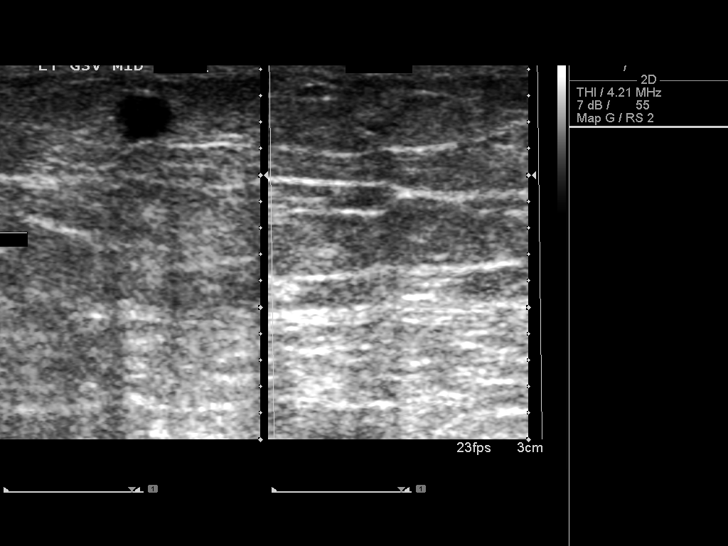
[im 18/35]
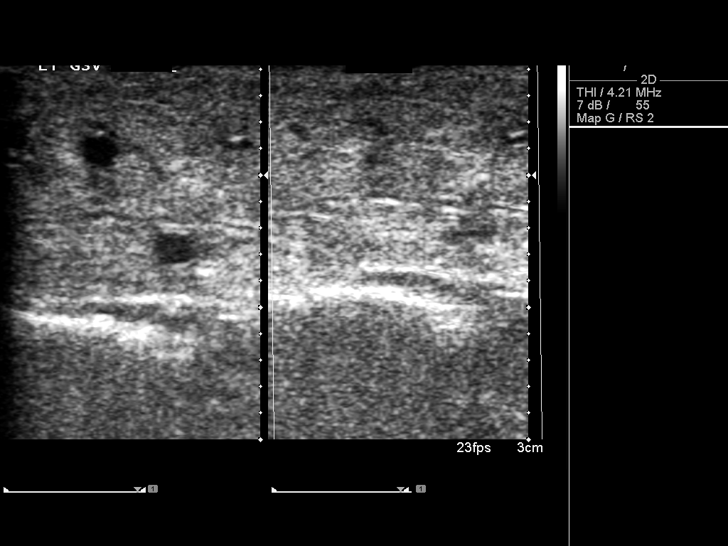
[im 21/35]
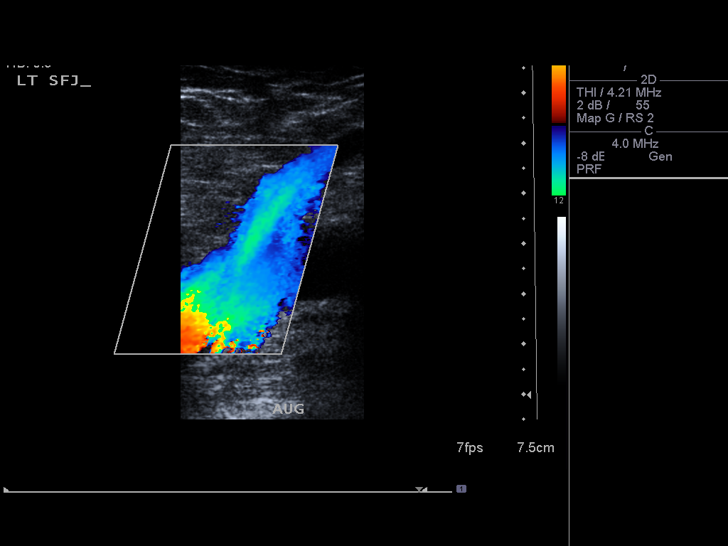
[im 24/35]
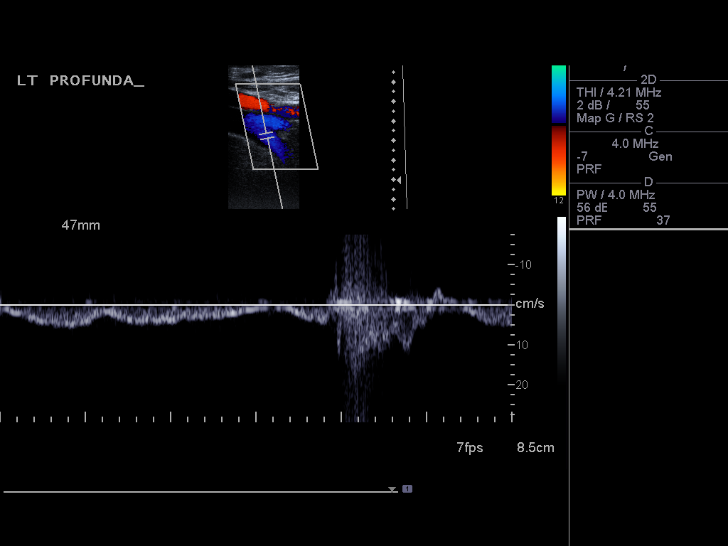
[im 27/35]
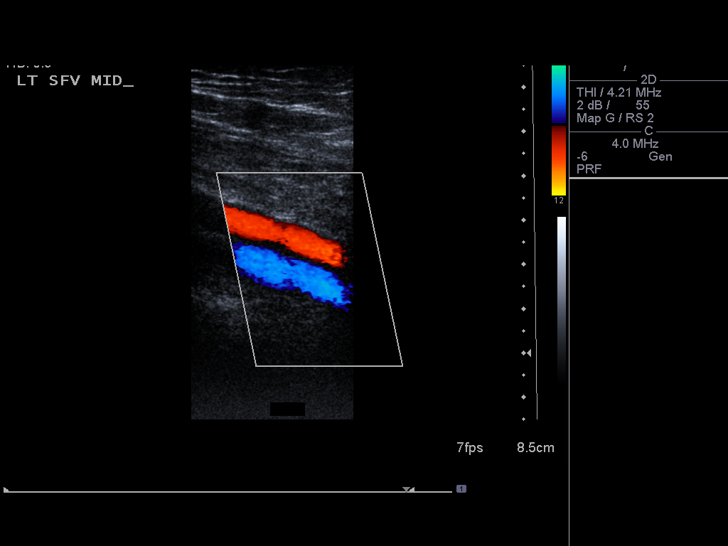
[im 29/35]
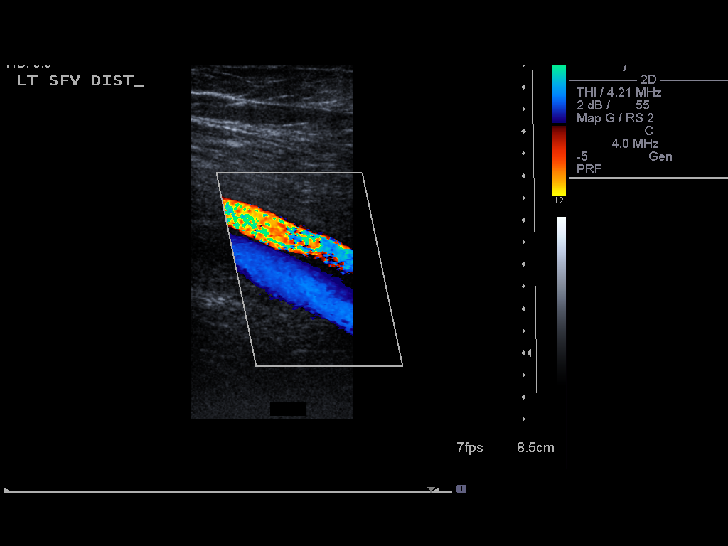
[im 32/35]
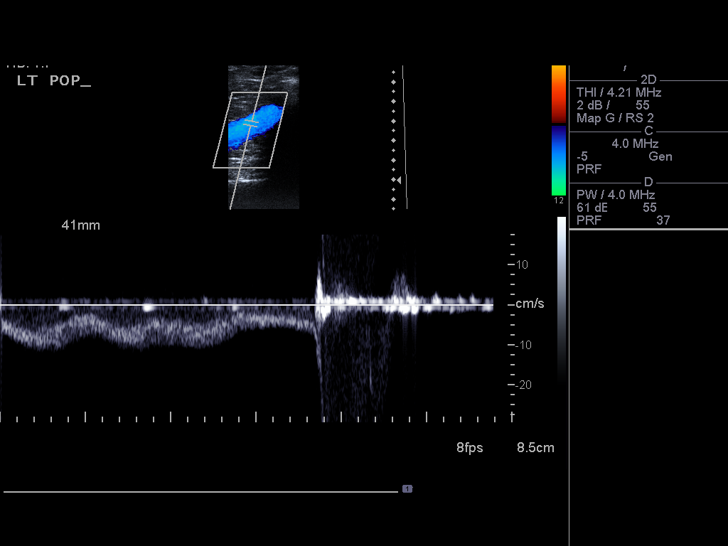
[im 35/35]
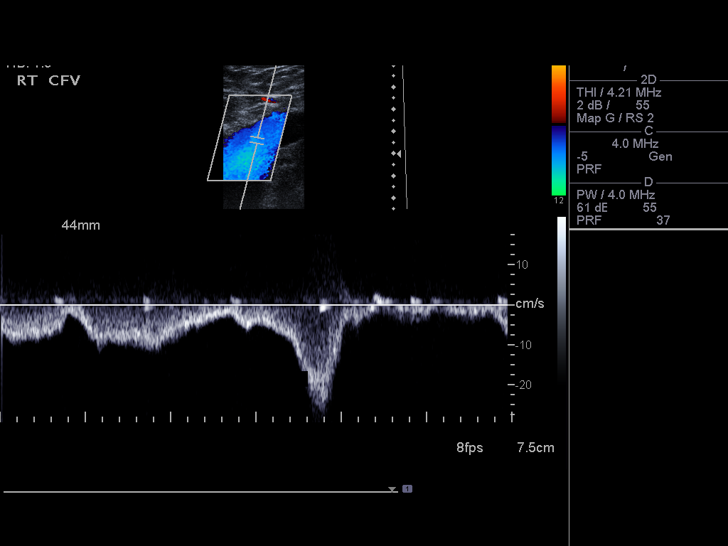

[14 of 24 positions shown; findings below may reference images not displayed]

FINDINGS: Normal compressibility of the common femoral, superficial femoral,
and popliteal veins, as well as the proximal calf veins. No filling
defects to suggest DVT on grayscale or color Doppler imaging.
Doppler waveforms show normal direction of venous flow, normal
respiratory phasicity and response to augmentation. Visualized
segments of the saphenous venous system normal in caliber and
compressibility. Survey views of the contralateral common femoral
vein are unremarkable.
IMPRESSION: 1. No evidence of lower extremity deep vein thrombosis, left.

## 2016-10-04 DIAGNOSIS — M0589 Other rheumatoid arthritis with rheumatoid factor of multiple sites: Secondary | ICD-10-CM | POA: Diagnosis not present

## 2016-11-01 DIAGNOSIS — Z79899 Other long term (current) drug therapy: Secondary | ICD-10-CM | POA: Diagnosis not present

## 2016-11-01 DIAGNOSIS — M0589 Other rheumatoid arthritis with rheumatoid factor of multiple sites: Secondary | ICD-10-CM | POA: Diagnosis not present

## 2016-11-29 DIAGNOSIS — M0589 Other rheumatoid arthritis with rheumatoid factor of multiple sites: Secondary | ICD-10-CM | POA: Diagnosis not present

## 2016-12-27 DIAGNOSIS — M0589 Other rheumatoid arthritis with rheumatoid factor of multiple sites: Secondary | ICD-10-CM | POA: Diagnosis not present

## 2017-01-03 DIAGNOSIS — M5136 Other intervertebral disc degeneration, lumbar region: Secondary | ICD-10-CM | POA: Diagnosis not present

## 2017-01-03 DIAGNOSIS — M1A09X0 Idiopathic chronic gout, multiple sites, without tophus (tophi): Secondary | ICD-10-CM | POA: Diagnosis not present

## 2017-01-03 DIAGNOSIS — M0589 Other rheumatoid arthritis with rheumatoid factor of multiple sites: Secondary | ICD-10-CM | POA: Diagnosis not present

## 2017-01-03 DIAGNOSIS — M150 Primary generalized (osteo)arthritis: Secondary | ICD-10-CM | POA: Diagnosis not present

## 2017-01-03 DIAGNOSIS — Z6839 Body mass index (BMI) 39.0-39.9, adult: Secondary | ICD-10-CM | POA: Diagnosis not present

## 2017-01-03 DIAGNOSIS — M25512 Pain in left shoulder: Secondary | ICD-10-CM | POA: Diagnosis not present

## 2017-01-03 DIAGNOSIS — M25511 Pain in right shoulder: Secondary | ICD-10-CM | POA: Diagnosis not present

## 2017-01-03 DIAGNOSIS — Z79899 Other long term (current) drug therapy: Secondary | ICD-10-CM | POA: Diagnosis not present

## 2017-01-03 DIAGNOSIS — E669 Obesity, unspecified: Secondary | ICD-10-CM | POA: Diagnosis not present

## 2017-01-25 DIAGNOSIS — M0589 Other rheumatoid arthritis with rheumatoid factor of multiple sites: Secondary | ICD-10-CM | POA: Diagnosis not present

## 2017-02-22 DIAGNOSIS — M0589 Other rheumatoid arthritis with rheumatoid factor of multiple sites: Secondary | ICD-10-CM | POA: Diagnosis not present

## 2017-03-22 DIAGNOSIS — M0589 Other rheumatoid arthritis with rheumatoid factor of multiple sites: Secondary | ICD-10-CM | POA: Diagnosis not present

## 2017-04-05 DIAGNOSIS — E669 Obesity, unspecified: Secondary | ICD-10-CM | POA: Diagnosis not present

## 2017-04-05 DIAGNOSIS — Z79899 Other long term (current) drug therapy: Secondary | ICD-10-CM | POA: Diagnosis not present

## 2017-04-05 DIAGNOSIS — M25511 Pain in right shoulder: Secondary | ICD-10-CM | POA: Diagnosis not present

## 2017-04-05 DIAGNOSIS — Z6839 Body mass index (BMI) 39.0-39.9, adult: Secondary | ICD-10-CM | POA: Diagnosis not present

## 2017-04-05 DIAGNOSIS — M5136 Other intervertebral disc degeneration, lumbar region: Secondary | ICD-10-CM | POA: Diagnosis not present

## 2017-04-05 DIAGNOSIS — M25512 Pain in left shoulder: Secondary | ICD-10-CM | POA: Diagnosis not present

## 2017-04-05 DIAGNOSIS — M0589 Other rheumatoid arthritis with rheumatoid factor of multiple sites: Secondary | ICD-10-CM | POA: Diagnosis not present

## 2017-04-05 DIAGNOSIS — M150 Primary generalized (osteo)arthritis: Secondary | ICD-10-CM | POA: Diagnosis not present

## 2017-04-05 DIAGNOSIS — M1A09X0 Idiopathic chronic gout, multiple sites, without tophus (tophi): Secondary | ICD-10-CM | POA: Diagnosis not present

## 2017-04-19 DIAGNOSIS — Z79899 Other long term (current) drug therapy: Secondary | ICD-10-CM | POA: Diagnosis not present

## 2017-04-19 DIAGNOSIS — M0589 Other rheumatoid arthritis with rheumatoid factor of multiple sites: Secondary | ICD-10-CM | POA: Diagnosis not present

## 2017-04-25 DIAGNOSIS — N401 Enlarged prostate with lower urinary tract symptoms: Secondary | ICD-10-CM | POA: Diagnosis not present

## 2017-04-25 DIAGNOSIS — R350 Frequency of micturition: Secondary | ICD-10-CM | POA: Diagnosis not present

## 2017-04-25 DIAGNOSIS — R3912 Poor urinary stream: Secondary | ICD-10-CM | POA: Diagnosis not present

## 2017-04-25 DIAGNOSIS — R972 Elevated prostate specific antigen [PSA]: Secondary | ICD-10-CM | POA: Diagnosis not present

## 2017-04-30 DIAGNOSIS — Z23 Encounter for immunization: Secondary | ICD-10-CM | POA: Diagnosis not present

## 2017-05-04 DIAGNOSIS — N401 Enlarged prostate with lower urinary tract symptoms: Secondary | ICD-10-CM | POA: Diagnosis not present

## 2017-05-04 DIAGNOSIS — R972 Elevated prostate specific antigen [PSA]: Secondary | ICD-10-CM | POA: Diagnosis not present

## 2017-05-04 DIAGNOSIS — R351 Nocturia: Secondary | ICD-10-CM | POA: Diagnosis not present

## 2017-05-17 DIAGNOSIS — M0589 Other rheumatoid arthritis with rheumatoid factor of multiple sites: Secondary | ICD-10-CM | POA: Diagnosis not present

## 2017-05-21 DIAGNOSIS — Z0000 Encounter for general adult medical examination without abnormal findings: Secondary | ICD-10-CM | POA: Diagnosis not present

## 2017-05-21 DIAGNOSIS — I10 Essential (primary) hypertension: Secondary | ICD-10-CM | POA: Diagnosis not present

## 2017-05-21 DIAGNOSIS — M1A0790 Idiopathic chronic gout, unspecified ankle and foot, without tophus (tophi): Secondary | ICD-10-CM | POA: Diagnosis not present

## 2017-05-21 DIAGNOSIS — R7309 Other abnormal glucose: Secondary | ICD-10-CM | POA: Diagnosis not present

## 2017-05-24 DIAGNOSIS — E119 Type 2 diabetes mellitus without complications: Secondary | ICD-10-CM

## 2017-05-24 HISTORY — DX: Type 2 diabetes mellitus without complications: E11.9

## 2017-06-07 DIAGNOSIS — M1A0790 Idiopathic chronic gout, unspecified ankle and foot, without tophus (tophi): Secondary | ICD-10-CM | POA: Diagnosis not present

## 2017-06-07 DIAGNOSIS — M069 Rheumatoid arthritis, unspecified: Secondary | ICD-10-CM | POA: Diagnosis not present

## 2017-06-07 DIAGNOSIS — R7309 Other abnormal glucose: Secondary | ICD-10-CM | POA: Diagnosis not present

## 2017-06-07 DIAGNOSIS — I10 Essential (primary) hypertension: Secondary | ICD-10-CM | POA: Diagnosis not present

## 2017-06-07 DIAGNOSIS — R972 Elevated prostate specific antigen [PSA]: Secondary | ICD-10-CM | POA: Diagnosis not present

## 2017-06-18 ENCOUNTER — Ambulatory Visit: Payer: Medicare Other | Admitting: Cardiology

## 2017-06-18 DIAGNOSIS — M0589 Other rheumatoid arthritis with rheumatoid factor of multiple sites: Secondary | ICD-10-CM | POA: Diagnosis not present

## 2017-06-29 ENCOUNTER — Encounter: Payer: Self-pay | Admitting: Cardiology

## 2017-06-29 ENCOUNTER — Ambulatory Visit (INDEPENDENT_AMBULATORY_CARE_PROVIDER_SITE_OTHER): Payer: Medicare Other | Admitting: Cardiology

## 2017-06-29 VITALS — BP 140/78 | HR 54 | Ht 70.0 in | Wt 267.2 lb

## 2017-06-29 DIAGNOSIS — E119 Type 2 diabetes mellitus without complications: Secondary | ICD-10-CM | POA: Diagnosis not present

## 2017-06-29 DIAGNOSIS — Z8249 Family history of ischemic heart disease and other diseases of the circulatory system: Secondary | ICD-10-CM | POA: Insufficient documentation

## 2017-06-29 DIAGNOSIS — R072 Precordial pain: Secondary | ICD-10-CM

## 2017-06-29 DIAGNOSIS — I10 Essential (primary) hypertension: Secondary | ICD-10-CM | POA: Insufficient documentation

## 2017-06-29 DIAGNOSIS — R0609 Other forms of dyspnea: Secondary | ICD-10-CM | POA: Insufficient documentation

## 2017-06-29 DIAGNOSIS — Z01818 Encounter for other preprocedural examination: Secondary | ICD-10-CM

## 2017-06-29 DIAGNOSIS — R071 Chest pain on breathing: Secondary | ICD-10-CM | POA: Insufficient documentation

## 2017-06-29 DIAGNOSIS — R600 Localized edema: Secondary | ICD-10-CM | POA: Diagnosis not present

## 2017-06-29 DIAGNOSIS — G4733 Obstructive sleep apnea (adult) (pediatric): Secondary | ICD-10-CM | POA: Insufficient documentation

## 2017-06-29 MED ORDER — METOPROLOL TARTRATE 50 MG PO TABS: 50.0000 mg | ORAL_TABLET | Freq: Once | ORAL | 0 refills | Status: AC

## 2017-06-29 NOTE — Progress Notes (Signed)
PCP: Collin Seashore, MD  Clinic Note: Chief Complaint  Patient presents with  . New Patient (Initial Visit)    Cardiovascular risk stratification    HPI:  Collin King is a 66 y.o. male who is being seen today for the evaluation of cardiovascular risk assessment at the request of Collin Seashore, MD --> he has a relatively significant family history of coronary artery disease as well as some exertional dyspnea. His wife Collin King is also my patient.  He carries a diagnosis of rheumatoid arthritis, likely diabetes (A1c 7.3-trying to use lifestyle modifications at this point), hypertension and gout.Collin King was seen by Dr. Ashby Dawes back on June 07, 2017 and referred due to complaints of exertional dyspnea.  Recent Hospitalizations: n/a  Studies Personally Reviewed - (if available, images/films reviewed: From Epic Chart or Care Everywhere)  None  Interval History: Collin King presents here for cardiology evaluation, mostly at his request because of his family history, and knowing that he is quite out of shape, has gained significant weight, and does note some exertional dyspnea. He says it since he retired in 2005, he has become increasingly less active and does not do any routine activity.  He is gained about 40 pounds.  He notes that he will get short of breath if he does any significant exertion such as going up stairs or steps.  Not associated with any chest tightness or pressure however he does have some off and on sharp pains in his chest that are either mid axillary line area or occasionally up on the left upper central chest.  This is described as a sharp pain that is worse with taking a deep breath or with certain movements at night.  For instance he can be rolling over at night and it will "catch" and cause significant pain.  This is also associated with the pain in a similar location in the back.  He denies any PND or orthopnea, but does note some mild swelling  in his legs with "shiny skin "that is somewhat tense.  It is associated with a little reduced balance/coordination.  He does have a little dizziness pick but mostly notes that it takes him a little time to get oriented. He only notes rare fluttering sensations up in his throat associated with shortness of breath that do not occur with any particular activity and the only last a few seconds.  He has sleep apnea, but does not use his CPAP, mostly because he has not been able to tolerate the masks without having claustrophobia.  Therefore he somewhat tired in the day and feels that he sleeps "a lot.  No syncope/near syncope. No TIA/amaurosis fugax symptoms. No melena, hematochezia, hematuria, or epstaxis. No claudication.  ROS: A comprehensive was performed. Review of Systems  Constitutional: Negative for weight loss (Weight gain).  HENT: Negative for congestion, nosebleeds and sinus pain.   Respiratory: Negative for cough, wheezing and stridor.   Cardiovascular: Positive for leg swelling.       Per HPI  Gastrointestinal: Negative for blood in stool and melena.  Musculoskeletal: Positive for joint pain (Arthritis).  Skin:       Notes decreased leg hair from knees down  Neurological: Positive for dizziness.       Poor balance  Psychiatric/Behavioral: Negative for memory loss. The patient is not nervous/anxious and does not have insomnia.   All other systems reviewed and are negative.   I have reviewed and (if needed) personally updated the patient's problem  list, medications, allergies, past medical and surgical history, social and family history.   Past Medical History:  Diagnosis Date  . Arthritis   . Diabetes mellitus, type II ([place]) 05/2017   A1c 7.3  . Gout   . History of sleep apnea 2004   Status post surgery  . Hypertension   . Rheumatoid arthritis involving both knees [facility])     Past Surgical History:  Procedure Laterality Date  . HERNIA REPAIR    . Sleep apnea surgery   2004  . THORACOSCOPY W/ THORACIC SYMPATHECTOMY     T2-T3  . WRIST SURGERY  1977   Status post prosthesis    Current Meds  Medication Sig  . Abatacept (ORENCIA IV) Inject into the vein every 30 (thirty) days.  Marland Kitchen alfuzosin (UROXATRAL) 10 MG 24 hr tablet Take 10 mg by mouth daily.  Marland Kitchen allopurinol (ZYLOPRIM) 100 MG tablet Take 200 mg by mouth daily.  . hydrochlorothiazide (HYDRODIURIL) 12.5 MG tablet Take 12.5 mg by mouth daily.  . sertraline (ZOLOFT) 50 MG tablet Take 75 mg by mouth daily.  Marland Kitchen terazosin (HYTRIN) 5 MG capsule Take 5 mg by mouth daily.    No Known Allergies  Social History   Socioeconomic History  . Marital status: Married    Spouse name: Collin King  . Number of children: 4  . Years of education: None  . Highest education level: None  Social Needs  . Financial resource strain: None  . Food insecurity - worry: None  . Food insecurity - inability: None  . Transportation needs - medical: None  . Transportation needs - non-medical: None  Occupational History  . 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 Smoking status: Never Smoker  . Smokeless tobacco: Never Used  Substance and Sexual Activity  . Alcohol use: No    Frequency: Never  . Drug use: No  . Sexual activity: Not Currently    Comment: Wife has been sick  Other Topics Concern  . None  Social History Narrative   He lives with his wife Collin King.  He has 4 children and 1 stepson.   He quit smoking 25-30 years ago.  Previously smoked 1 pack a day.   More than 4 cups of coffee per day.    family history includes CAD in his father; Coronary artery disease in his paternal uncle; Diabetes Mellitus II in his father; Heart attack in his father.  Wt Readings from Last 3 Encounters:  06/29/17 267 lb 3.2 oz (121.2 kg)    PHYSICAL EXAM BP 140/78   Pulse (!) 54   Ht 5\' 10"  (1.778 m)   Wt 267 lb 3.2 oz (121.2 kg)   BMI 38.34 kg/m  Physical Exam  Constitutional: He is  oriented to person, place, and time. He appears well-developed and well-nourished.  Borderline morbidly obese.  Well-groomed  HENT:  Head: Normocephalic and atraumatic.  Mouth/Throat: Oropharynx is clear and moist. No oropharyngeal exudate.  Eyes: Conjunctivae and EOM are normal. Pupils are equal, round, and reactive to light. No scleral icterus.  Neck: Normal range of motion. Neck supple. No hepatojugular reflux and no JVD present. Carotid bruit is not present. No tracheal deviation present. No thyromegaly present.  Cardiovascular: Normal rate and regular rhythm. PMI is not displaced (Unable to palpate). Exam reveals distant heart sounds. Exam reveals no gallop and no friction rub.  No murmur heard. Pulmonary/Chest: Effort normal and breath sounds normal. No respiratory  distress. He has no wheezes.  Abdominal: Soft. Bowel sounds are normal. He exhibits no distension. There is no tenderness. There is no rebound.  Musculoskeletal: Normal range of motion. He exhibits edema (At least 1+ nonpitting edema bilaterally.).  Neurological: He is alert and oriented to person, place, and time. No cranial nerve deficit.  Skin: Skin is warm and dry.  He does have diminished leg hair below the knees with tense edema.  Not pitting.No notable discoloration  Psychiatric: He has a normal mood and affect. His behavior is normal. Judgment and thought content normal.     Adult ECG Report  Rate: 54 ;  Rhythm: sinus bradycardia and Borderline low voltage.  Otherwise nonspecific ST and T wave changes.  Normal axis, intervals and durations.;   Narrative Interpretation: Relatively stable/normal EKG   Other studies Reviewed: Additional studies/ records that were reviewed today include:  Recent Labs: No labs available   ASSESSMENT / PLAN: Problem List Items Addressed This Visit    Chest pain on breathing    The symptoms he describes some more consistent with musculoskeletal chest pain.  They are worse with deep  inspiration and coughing is as well as moving around.  Not worse with exertion. However, chest pain associated shortness of breath in a gentleman with significant family history of CAD in his father side warrants evaluation. Plan: Coronary calcium score with coronary CTA and possible CT FFR. -We will also check echocardiogram to exclude pericarditis      Relevant Orders   EKG 17-OHYW   Basic metabolic panel   CT CORONARY MORPH W/CTA COR W/SCORE W/CA W/CM &/OR WO/CM   CT CORONARY FRACTIONAL FLOW RESERVE DATA PREP   CT CORONARY FRACTIONAL FLOW RESERVE FLUID ANALYSIS   ECHOCARDIOGRAM COMPLETE   Diabetes mellitus type II, non insulin dependent (HCC)   Relevant Orders   Basic metabolic panel   CT CORONARY MORPH W/CTA COR W/SCORE W/CA W/CM &/OR WO/CM   CT CORONARY FRACTIONAL FLOW RESERVE DATA PREP   CT CORONARY FRACTIONAL FLOW RESERVE FLUID ANALYSIS   Dyspnea on exertion    Could easily be related to deconditioning, however, he has a significant family history of CAD and is now noticing worsening exertional dyspnea. Plan: CT coronary calcium scoring with coronary CT angiogram and possible CT FFR. Also check 2D echocardiogram to evaluate filling pressures and ejection fraction      Relevant Orders   EKG [id number]   Basic metabolic panel   CT CORONARY MORPH W/CTA COR W/SCORE W/CA W/CM &/OR WO/CM   CT CORONARY FRACTIONAL FLOW RESERVE DATA PREP   CT CORONARY FRACTIONAL FLOW RESERVE FLUID ANALYSIS   ECHOCARDIOGRAM COMPLETE   Edema of both legs    Bilateral lower extremity swelling with out significant edema.  Cannot exclude some component of diastolic heart failure, however most likely consistent with venous stasis insufficiency. Plan: Evaluate for CHF with echocardiogram.      Relevant Orders   CT CORONARY MORPH W/CTA COR W/SCORE W/CA W/CM &/OR WO/CM   CT CORONARY FRACTIONAL FLOW RESERVE DATA PREP   CT CORONARY FRACTIONAL FLOW RESERVE FLUID ANALYSIS   Essential hypertension (Chronic)     Does have borderline blood pressure today which in the setting of exertional dyspnea leads question for possible hypertensive heart disease with diastolic dysfunction.  He is still on HCTZ but evaluate for filling pressures and potentially consider afterload reduction. Plan:-2D echocardiogram      Relevant Orders   CT CORONARY MORPH W/CTA COR W/SCORE W/CA W/CM &/OR WO/CM  CT CORONARY FRACTIONAL FLOW RESERVE DATA PREP   CT CORONARY FRACTIONAL FLOW RESERVE FLUID ANALYSIS   Family history of premature coronary artery disease - Primary    His father and multiple paternal aunts and uncles had significant CAD.  With him now having exertional dyspnea as well as some edema, it is appropriate to assess for ischemia with a coronary CT angiogram.  Otherwise close glycemic control, blood pressure control and lipid management.      Relevant Orders   EKG 12-Lead   CT CORONARY MORPH W/CTA COR W/SCORE W/CA W/CM &/OR WO/CM   CT CORONARY FRACTIONAL FLOW RESERVE DATA PREP   CT CORONARY FRACTIONAL FLOW RESERVE FLUID ANALYSIS   ECHOCARDIOGRAM COMPLETE   Morbid obesity ([place])    He needs to get him to lose weight.  We talked the importance of getting routine exercise.  I think having an ischemic assessment with CT angiogram of the heart will provide peace of mind to allow for increased activity, exercise      Relevant Orders   CT CORONARY MORPH W/CTA COR W/SCORE W/CA W/CM &/OR WO/CM   CT CORONARY FRACTIONAL FLOW RESERVE DATA PREP   CT CORONARY FRACTIONAL FLOW RESERVE FLUID ANALYSIS   OSA (obstructive sleep apnea)   Relevant Orders   CT CORONARY MORPH W/CTA COR W/SCORE W/CA W/CM &/OR WO/CM   CT CORONARY FRACTIONAL FLOW RESERVE DATA PREP   CT CORONARY FRACTIONAL FLOW RESERVE FLUID ANALYSIS    Other Visit Diagnoses    Pre-op testing       Relevant Orders   Basic metabolic panel   CT CORONARY MORPH W/CTA COR W/SCORE W/CA W/CM &/OR WO/CM   CT CORONARY FRACTIONAL FLOW RESERVE DATA PREP   CT CORONARY  FRACTIONAL FLOW RESERVE FLUID ANALYSIS      Current medicines are reviewed at length with the patient today. (+/- concerns) n/a The following changes have been made: n/a  Patient Instructions  Medication instructions No changes to current medications,except  the day of coronary CT - take metoprolol ( lopressor) an hour before the test   SCHEDULE AT [place] has requested that you have an echocardiogram. Echocardiography is a painless test that uses sound waves to create images of your heart. It provides your doctor with information about the size and shape of your heart and how well your heart's chambers and valves are working. This procedure takes approximately one hour. There are no restrictions for this procedure.   SCHEDULE AT [place] physician has requested that you have cardiac CT. Cardiac computed tomography (CT) is a painless test that uses an x-ray machine to take clear, detailed pictures of your heart. For further information please visit [email or web address]. Please follow instruction sheet as given.   Your physician recommends that you schedule a follow-up appointment in 2 [place].   Studies Ordered:   Orders Placed This Encounter  Procedures  . CT CORONARY MORPH W/CTA COR W/SCORE W/CA W/CM &/OR WO/CM  . CT CORONARY FRACTIONAL FLOW RESERVE DATA PREP  . CT CORONARY FRACTIONAL FLOW RESERVE FLUID ANALYSIS  . Basic metabolic panel  . EKG 12-Lead  . ECHOCARDIOGRAM COMPLETE      Glenetta Hew, M.D., M.S. Interventional Cardiologist   Pager # [phone] Phone # [phone] [address]. [place], [place] [address]

## 2017-06-29 NOTE — Patient Instructions (Addendum)
Medication instructions No changes to current medications,except  the day of coronary CT - take metoprolol ( lopressor) an hour before the test   SCHEDULE AT [place] has requested that you have an echocardiogram. Echocardiography is a painless test that uses sound waves to create images of your heart. It provides your doctor with information about the size and shape of your heart and how well your heart's chambers and valves are working. This procedure takes approximately one hour. There are no restrictions for this procedure.   SCHEDULE AT [place] physician has requested that you have cardiac CT. Cardiac computed tomography (CT) is a painless test that uses an x-ray machine to take clear, detailed pictures of your heart. For further information please visit [email or web address]. Please follow instruction sheet as given.     Your physician recommends that you schedule a follow-up appointment in 2 [place].   INSTRUCTIONS FOR  CORONARY CTA    Please arrive at the [facility] main entrance of [facility] at (30-45 minutes prior to test start time)  [facility] [place], [place] [address] [phone]  Proceed to the [facility] Radiology Department (First Floor).  Please follow these instructions carefully (unless otherwise directed):  PLEASE HAVE LABS - BMP  AT LEAST ONE WEEK PRIOR TO TEST  On the Night Before the Test: . Drink plenty of water. . Do not consume any caffeinated/decaffeinated beverages or chocolate 12 hours prior to your test. . Do not take any antihistamines 12 hours prior to your test.    On the Day of the Test: . Drink plenty of water. Do not drink any water within one hour of the test. . Do not eat any food 4 hours prior to the test. . You may take your regular medications prior to the test. . IF NOT ON A BETA BLOCKER - Take 50 mg of  lopressor (metoprolol) one hour before the test.   After the Test: . Drink plenty of water. . After receiving IV contrast, you may experience a mild flushed feeling. This is normal. . On occasion, you may experience a mild rash up to 24 hours after the test. This is not dangerous. If this occurs, you can take Benadryl 25 mg and increase your fluid intake. . If you experience trouble breathing, this can be serious. If it is severe call 911 IMMEDIATELY. If it is mild, please call our office.

## 2017-07-01 ENCOUNTER — Encounter: Payer: Self-pay | Admitting: Cardiology

## 2017-07-01 NOTE — Assessment & Plan Note (Signed)
Could easily be related to deconditioning, however, he has a significant family history of CAD and is now noticing worsening exertional dyspnea. Plan: CT coronary calcium scoring with coronary CT angiogram and possible CT FFR. Also check 2D echocardiogram to evaluate filling pressures and ejection fraction

## 2017-07-01 NOTE — Assessment & Plan Note (Signed)
His father and multiple paternal aunts and uncles had significant CAD.  With him now having exertional dyspnea as well as some edema, it is appropriate to assess for ischemia with a coronary CT angiogram.  Otherwise close glycemic control, blood pressure control and lipid management.

## 2017-07-01 NOTE — Assessment & Plan Note (Signed)
The symptoms he describes some more consistent with musculoskeletal chest pain.  They are worse with deep inspiration and coughing is as well as moving around.  Not worse with exertion. However, chest pain associated shortness of breath in a gentleman with significant family history of CAD in his father side warrants evaluation. Plan: Coronary calcium score with coronary CTA and possible CT FFR. -We will also check echocardiogram to exclude pericarditis

## 2017-07-01 NOTE — Assessment & Plan Note (Signed)
Does have borderline blood pressure today which in the setting of exertional dyspnea leads question for possible hypertensive heart disease with diastolic dysfunction.  He is still on HCTZ but evaluate for filling pressures and potentially consider afterload reduction. Plan:-2D echocardiogram

## 2017-07-01 NOTE — Assessment & Plan Note (Signed)
He needs to get him to lose weight.  We talked the importance of getting routine exercise.  I think having an ischemic assessment with CT angiogram of the heart will provide peace of mind to allow for increased activity, exercise

## 2017-07-01 NOTE — Assessment & Plan Note (Signed)
Bilateral lower extremity swelling with out significant edema.  Cannot exclude some component of diastolic heart failure, however most likely consistent with venous stasis insufficiency. Plan: Evaluate for CHF with echocardiogram.

## 2017-07-05 ENCOUNTER — Other Ambulatory Visit (HOSPITAL_COMMUNITY): Payer: Medicare Other

## 2017-07-11 ENCOUNTER — Other Ambulatory Visit: Payer: Self-pay

## 2017-07-11 ENCOUNTER — Ambulatory Visit (HOSPITAL_COMMUNITY): Payer: Medicare Other | Attending: Internal Medicine

## 2017-07-11 DIAGNOSIS — I10 Essential (primary) hypertension: Secondary | ICD-10-CM | POA: Insufficient documentation

## 2017-07-11 DIAGNOSIS — R0609 Other forms of dyspnea: Secondary | ICD-10-CM

## 2017-07-11 DIAGNOSIS — R071 Chest pain on breathing: Secondary | ICD-10-CM | POA: Diagnosis not present

## 2017-07-11 DIAGNOSIS — E119 Type 2 diabetes mellitus without complications: Secondary | ICD-10-CM | POA: Diagnosis not present

## 2017-07-11 DIAGNOSIS — Z8249 Family history of ischemic heart disease and other diseases of the circulatory system: Secondary | ICD-10-CM | POA: Diagnosis not present

## 2017-07-18 DIAGNOSIS — M0589 Other rheumatoid arthritis with rheumatoid factor of multiple sites: Secondary | ICD-10-CM | POA: Diagnosis not present

## 2017-08-07 DIAGNOSIS — M25512 Pain in left shoulder: Secondary | ICD-10-CM | POA: Diagnosis not present

## 2017-08-07 DIAGNOSIS — E119 Type 2 diabetes mellitus without complications: Secondary | ICD-10-CM | POA: Diagnosis not present

## 2017-08-07 DIAGNOSIS — M150 Primary generalized (osteo)arthritis: Secondary | ICD-10-CM | POA: Diagnosis not present

## 2017-08-07 DIAGNOSIS — R071 Chest pain on breathing: Secondary | ICD-10-CM | POA: Diagnosis not present

## 2017-08-07 DIAGNOSIS — R0609 Other forms of dyspnea: Secondary | ICD-10-CM | POA: Diagnosis not present

## 2017-08-07 DIAGNOSIS — M5136 Other intervertebral disc degeneration, lumbar region: Secondary | ICD-10-CM | POA: Diagnosis not present

## 2017-08-07 DIAGNOSIS — E669 Obesity, unspecified: Secondary | ICD-10-CM | POA: Diagnosis not present

## 2017-08-07 DIAGNOSIS — M1A09X0 Idiopathic chronic gout, multiple sites, without tophus (tophi): Secondary | ICD-10-CM | POA: Diagnosis not present

## 2017-08-07 DIAGNOSIS — Z79899 Other long term (current) drug therapy: Secondary | ICD-10-CM | POA: Diagnosis not present

## 2017-08-07 DIAGNOSIS — M25511 Pain in right shoulder: Secondary | ICD-10-CM | POA: Diagnosis not present

## 2017-08-07 DIAGNOSIS — M0589 Other rheumatoid arthritis with rheumatoid factor of multiple sites: Secondary | ICD-10-CM | POA: Diagnosis not present

## 2017-08-07 DIAGNOSIS — Z6839 Body mass index (BMI) 39.0-39.9, adult: Secondary | ICD-10-CM | POA: Diagnosis not present

## 2017-08-07 DIAGNOSIS — Z01818 Encounter for other preprocedural examination: Secondary | ICD-10-CM | POA: Diagnosis not present

## 2017-08-07 LAB — BASIC METABOLIC PANEL
BUN/Creatinine Ratio: 15 (ref 10–24)
BUN: 17 mg/dL (ref 8–27)
CO2: 23 mmol/L (ref 20–29)
Calcium: 9.4 mg/dL (ref 8.6–10.2)
Chloride: 98 mmol/L (ref 96–106)
Creatinine, Ser: 1.1 mg/dL (ref 0.76–1.27)
GFR calc Af Amer: 79 mL/min/{1.73_m2} (ref >59)
GFR calc non Af Amer: 69 mL/min/{1.73_m2} (ref >59)
GLUCOSE: 310 mg/dL — AB (ref 65–99)
POTASSIUM: 4.5 mmol/L (ref 3.5–5.2)
SODIUM: 136 mmol/L (ref 134–144)

## 2017-08-15 ENCOUNTER — Ambulatory Visit (HOSPITAL_COMMUNITY)
Admission: RE | Admit: 2017-08-15 | Discharge: 2017-08-15 | Disposition: A | Discharge status: Home / Self Care | Payer: Medicare Other | Source: Ambulatory Visit | Attending: Cardiology | Admitting: Cardiology

## 2017-08-15 ENCOUNTER — Ambulatory Visit (HOSPITAL_COMMUNITY): Admission: RE | Admit: 2017-08-15 | Payer: Medicare Other | Source: Ambulatory Visit

## 2017-08-15 DIAGNOSIS — R600 Localized edema: Secondary | ICD-10-CM | POA: Diagnosis not present

## 2017-08-15 DIAGNOSIS — E119 Type 2 diabetes mellitus without complications: Secondary | ICD-10-CM

## 2017-08-15 DIAGNOSIS — I10 Essential (primary) hypertension: Secondary | ICD-10-CM | POA: Diagnosis not present

## 2017-08-15 DIAGNOSIS — K760 Fatty (change of) liver, not elsewhere classified: Secondary | ICD-10-CM | POA: Insufficient documentation

## 2017-08-15 DIAGNOSIS — R0609 Other forms of dyspnea: Secondary | ICD-10-CM | POA: Diagnosis not present

## 2017-08-15 DIAGNOSIS — Z01818 Encounter for other preprocedural examination: Secondary | ICD-10-CM | POA: Diagnosis not present

## 2017-08-15 DIAGNOSIS — Z6838 Body mass index (BMI) 38.0-38.9, adult: Secondary | ICD-10-CM | POA: Insufficient documentation

## 2017-08-15 DIAGNOSIS — R071 Chest pain on breathing: Secondary | ICD-10-CM | POA: Diagnosis not present

## 2017-08-15 DIAGNOSIS — G4733 Obstructive sleep apnea (adult) (pediatric): Secondary | ICD-10-CM

## 2017-08-15 DIAGNOSIS — R072 Precordial pain: Secondary | ICD-10-CM

## 2017-08-15 DIAGNOSIS — R079 Chest pain, unspecified: Secondary | ICD-10-CM | POA: Diagnosis not present

## 2017-08-15 DIAGNOSIS — Z8249 Family history of ischemic heart disease and other diseases of the circulatory system: Secondary | ICD-10-CM | POA: Diagnosis not present

## 2017-08-15 DIAGNOSIS — I2510 Atherosclerotic heart disease of native coronary artery without angina pectoris: Secondary | ICD-10-CM | POA: Diagnosis not present

## 2017-08-15 DIAGNOSIS — M0589 Other rheumatoid arthritis with rheumatoid factor of multiple sites: Secondary | ICD-10-CM | POA: Diagnosis not present

## 2017-08-15 MED ORDER — NITROGLYCERIN 0.4 MG SL SUBL
SUBLINGUAL_TABLET | SUBLINGUAL | Status: AC
  Administered 2017-08-15: 10:00:00
  Filled 2017-08-15: qty 1

## 2017-08-15 MED ORDER — IOPAMIDOL (ISOVUE-370) INJECTION 76%
INTRAVENOUS | Status: AC
  Administered 2017-08-15: 80 mL
  Filled 2017-08-15: qty 100

## 2017-08-31 ENCOUNTER — Ambulatory Visit (INDEPENDENT_AMBULATORY_CARE_PROVIDER_SITE_OTHER): Payer: Medicare Other | Admitting: Cardiology

## 2017-08-31 ENCOUNTER — Encounter: Payer: Self-pay | Admitting: Cardiology

## 2017-08-31 DIAGNOSIS — R071 Chest pain on breathing: Secondary | ICD-10-CM | POA: Diagnosis not present

## 2017-08-31 DIAGNOSIS — R0609 Other forms of dyspnea: Secondary | ICD-10-CM

## 2017-08-31 DIAGNOSIS — Z8249 Family history of ischemic heart disease and other diseases of the circulatory system: Secondary | ICD-10-CM | POA: Diagnosis not present

## 2017-08-31 DIAGNOSIS — R600 Localized edema: Secondary | ICD-10-CM | POA: Diagnosis not present

## 2017-08-31 DIAGNOSIS — I10 Essential (primary) hypertension: Secondary | ICD-10-CM

## 2017-08-31 NOTE — Patient Instructions (Signed)
NO MEDICATION CHANGES      Your physician wants you to follow-up in 12 MONTHS WITH DR HARDING. You will receive a reminder letter in the mail two months in advance. If you don't receive a letter, please call our office to schedule the follow-up appointment.    If you need a refill on your cardiac medications before your next appointment, please call your pharmacy.  

## 2017-08-31 NOTE — Progress Notes (Signed)
PCP: Collin Seashore, MD  Clinic Note: Chief Complaint  Patient presents with  . 2 month f/u    pt c/o dizziness when he stands up; no other Sx.  Marland Kitchen Shortness of Breath    With exertion    HPI:  Collin King is a 67 y.o. male who is being seen today for the evaluation of cardiovascular risk assessment at the request of Collin Seashore, MD --> he has a relatively significant family history of coronary artery disease as well as some exertional dyspnea. His wife Collin King is also my patient.  He carries a diagnosis of rheumatoid arthritis, likely diabetes (A1c 7.3-trying to use lifestyle modifications at this point), hypertension and gout.. Dr. Ashby Dawes back on June 07, 2017 and referred due to complaints of exertional dyspnea. Seen  Collin King was seen initially on 06/29/2017 for exertional dyspnea with family history of premature CAD. Noted having gained a significant amount of weight and having progressively worsening dyspnea.  Recent Hospitalizations: n/a  Studies Personally Reviewed - (if available, images/films reviewed: From Epic Chart or Care Everywhere)  2-D Echo December 2018: LVEF 65-70%, mild LVH, normal wall motion, grade 1 DD,   indeterminate LV filling pressure, aortic valve sclerosis,   trivial TR, RVSP 27 mmHg, normal IVC.  CORONARY CALCIUM SCORE/CTA January 2019: Calcium score 104. Intermediate risk. Plaque in the proximal and mid LAD, but only mild stenosis. Left dominant system with large left PDA and posterolateral OM branch. Small nondominant RCA.  Interval History: Collin King presents here for follow-up evaluation to discuss result test.  He still notes exertional dyspnea, but still is significantly heavier than he used to be.  He has his hyperhidrosis and is profusely sweating today.  He has some intermittent positional dizziness and lightheadedness if he stands up fast or moves his head quickly.  But no syncope or near ischemic type symptoms.  No TIA or  amaurosis fugax symptoms.  Besides having exertional dyspnea, he denies any chest tightness or pressure with rest or exertion.  He does notes having trouble getting out and about.  He is a little bit concerned because his blood glucose levels have been  Higher and he is now been referred to an endocrinologist to help manage possible diabetes. Still having issues with his CPAP and thinks that that may also be partly related to his exertional dyspnea.  No real significant palpitations just intermittent fluttering sensations.  No PND, orthopnea with mild lower extremity edema.  No claudication.  ROS: A comprehensive was performed. Review of Systems  Constitutional: Negative for weight loss (Weight gain).  HENT: Negative for congestion, nosebleeds and sinus pain.   Respiratory: Negative for cough, wheezing and stridor.   Cardiovascular: Positive for leg swelling.       Per HPI  Gastrointestinal: Negative for blood in stool and melena.  Musculoskeletal: Positive for joint pain (Arthritis).  Skin:       Notes decreased leg hair from knees down  Neurological: Positive for dizziness.       Poor balance  Psychiatric/Behavioral: Negative for memory loss. The patient is not nervous/anxious and does not have insomnia.   All other systems reviewed and are negative.   I have reviewed and (if needed) personally updated the patient's problem list, medications, allergies, past medical and surgical history, social and family history.   Past Medical History:  Diagnosis Date  . Arthritis   . Diabetes mellitus, type II ([place]) 05/2017   A1c 7.3  . Gout   . History  of sleep apnea 2004   Status post surgery  . Hypertension   . Rheumatoid arthritis involving both knees [facility])     Past Surgical History:  Procedure Laterality Date  . HERNIA REPAIR    . Sleep apnea surgery  2004  . THORACOSCOPY W/ THORACIC SYMPATHECTOMY     T2-T3  . WRIST SURGERY  1977   Status post prosthesis    Current Meds    Medication Sig  . Abatacept (ORENCIA IV) Inject into the vein every 30 (thirty) days.  Marland Kitchen alfuzosin (UROXATRAL) 10 MG 24 hr tablet Take 10 mg by mouth daily.  Marland Kitchen allopurinol (ZYLOPRIM) 100 MG tablet Take 200 mg by mouth daily.  . hydrochlorothiazide (HYDRODIURIL) 12.5 MG tablet Take 12.5 mg by mouth daily.  . sertraline (ZOLOFT) 50 MG tablet Take 75 mg by mouth daily.  Marland Kitchen terazosin (HYTRIN) 5 MG capsule Take 5 mg by mouth daily.    Allergies  Allergen Reactions  . Remicade [Infliximab] Swelling    Edema and SOB    Social History   Socioeconomic History  . Marital status: Married    Spouse name: Collin King  . Number of children: 4  . Years of education: None  . Highest education level: None  Social Needs  . Financial resource strain: None  . Food insecurity - worry: None  . Food insecurity - inability: None  . Transportation needs - medical: None  . Transportation needs - non-medical: None  Occupational History  . 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 Smoking status: Never Smoker  . Smokeless tobacco: Never Used  Substance and Sexual Activity  . Alcohol use: No    Frequency: Never  . Drug use: No  . Sexual activity: Not Currently    Comment: Wife has been sick  Other Topics Concern  . None  Social History Narrative   He lives with his wife Collin King.  He has 4 children and 1 stepson.   He quit smoking 25-30 years ago.  Previously smoked 1 pack a day.   More than 4 cups of coffee per day.    family history includes CAD in his father; Coronary artery disease in his paternal uncle; Diabetes Mellitus II in his father; Heart attack in his father.  Wt Readings from Last 3 Encounters:  08/31/17 268 lb (121.6 kg)  06/29/17 267 lb 3.2 oz (121.2 kg)    PHYSICAL EXAM BP 124/74 (BP Location: Left Arm, Patient Position: Sitting, Cuff Size: Large)   Pulse 65   Ht 5\' 10"  (1.778 m)   Wt 268 lb (121.6 kg)   BMI 38.45 kg/m  Physical Exam   Constitutional: He is oriented to person, place, and time. He appears well-developed and well-nourished.  Borderline morbidly obese.  Well-groomed  HENT:  Head: Normocephalic and atraumatic.  Mouth/Throat: Oropharynx is clear and moist. No oropharyngeal exudate.  Neck: No hepatojugular reflux and no JVD present. Carotid bruit is not present.  Cardiovascular: Normal rate and regular rhythm. PMI is not displaced (Unable to palpate). Exam reveals distant heart sounds. Exam reveals no gallop and no friction rub.  No murmur heard. Pulmonary/Chest: Effort normal and breath sounds normal. No respiratory distress. He has no wheezes.  Abdominal: Soft. Bowel sounds are normal. He exhibits no distension. There is no tenderness. There is no rebound.  Musculoskeletal: Normal range of motion. He exhibits edema (At least 1+ nonpitting edema bilaterally.).  Neurological: He is alert and oriented  to person, place, and time.  Skin:  He does have diminished leg hair below the knees with tense edema.  Not pitting.No notable discoloration. Diffuse swelling from the mid chest down.  Psychiatric: He has a normal mood and affect. His behavior is normal. Judgment and thought content normal.     Adult ECG Report Not checked    Other studies Reviewed: Additional studies/ records that were reviewed today include:  Recent Labs: No labs available   ASSESSMENT / PLAN: Problem List Items Addressed This Visit    Chest pain on breathing    No evidence of pericarditis on echocardiogram.  No significant coronary disease onset coronary CTA.  This would suggest possible musculoskeletal chest pain.      Dyspnea on exertion    Most likely now related to deconditioning and obesity.  He does have a very significant family history for CAD, but very reassuring results on his coronary calcium score.  Also echocardiogram was also relatively normal.  No suggestion of significant pulmonary pretension or diastolic  dysfunction.  Would simply continue to recommend weight loss and exercise.  Adequate blood pressure control.      Edema of both legs (Chronic)    Mild swelling, but no significant edema.  Not likely to be related to either pulmonary pretension or diastolic dysfunction.  Probably more consistent with venous stasis insufficiency.  Recommend support stockings and feet elevation.      Essential hypertension (Chronic)    Blood pressures well controlled today on current meds.  Continue to monitor.  No change for now.      Family history of premature coronary artery disease (Chronic)    Thankfully for him, his coronary calcium score was essentially only intermediate risk with mild plaquing in the LAD but no significant stenosis in the left dominant system. Simply continue risk factor modification most importantly he would be glycemic control and weight loss as his blood pressures controlled.      Morbid obesity ([place])    He may very well benefit from nutrition consultation.  Especially with him now meeting criteria for possible diabetes diagnosis. Talk about the importance of getting back to his exercise regimen.  He used to do really well when he first retired, but after having boil on his inner thigh while walking, he got out of the routine.  And now has not gotten back to exercising.         Current medicines are reviewed at length with the patient today. (+/- concerns) n/a The following changes have been made: n/a  Patient Instructions  NO MEDICATION CHANGES     Your physician wants you to follow-up in [place].You will receive a reminder letter in the mail two months in advance. If you don't receive a letter, please call our office to schedule the follow-up appointment.    If you need a refill on your cardiac medications before your next appointment, please call your pharmacy.      Studies Ordered:   No orders of the defined types were placed in this  encounter.     Glenetta Hew, M.D., M.S. Interventional Cardiologist   Pager # [phone] Phone # [phone] [address] [place], [place] [address]

## 2017-09-02 ENCOUNTER — Encounter: Payer: Self-pay | Admitting: Cardiology

## 2017-09-02 NOTE — Assessment & Plan Note (Signed)
Thankfully for him, his coronary calcium score was essentially only intermediate risk with mild plaquing in the LAD but no significant stenosis in the left dominant system. Simply continue risk factor modification most importantly he would be glycemic control and weight loss as his blood pressures controlled.

## 2017-09-02 NOTE — Assessment & Plan Note (Signed)
Blood pressures well controlled today on current meds.  Continue to monitor.  No change for now.

## 2017-09-02 NOTE — Assessment & Plan Note (Signed)
No evidence of pericarditis on echocardiogram.  No significant coronary disease onset coronary CTA.  This would suggest possible musculoskeletal chest pain.

## 2017-09-02 NOTE — Assessment & Plan Note (Signed)
He may very well benefit from nutrition consultation.  Especially with him now meeting criteria for possible diabetes diagnosis. Talk about the importance of getting back to his exercise regimen.  He used to do really well when he first retired, but after having boil on his inner thigh while walking, he got out of the routine.  And now has not gotten back to exercising.

## 2017-09-02 NOTE — Assessment & Plan Note (Signed)
Most likely now related to deconditioning and obesity.  He does have a very significant family history for CAD, but very reassuring results on his coronary calcium score.  Also echocardiogram was also relatively normal.  No suggestion of significant pulmonary pretension or diastolic dysfunction.  Would simply continue to recommend weight loss and exercise.  Adequate blood pressure control.

## 2017-09-02 NOTE — Assessment & Plan Note (Signed)
Mild swelling, but no significant edema.  Not likely to be related to either pulmonary pretension or diastolic dysfunction.  Probably more consistent with venous stasis insufficiency.  Recommend support stockings and feet elevation.

## 2017-09-05 DIAGNOSIS — I10 Essential (primary) hypertension: Secondary | ICD-10-CM | POA: Diagnosis not present

## 2017-09-05 DIAGNOSIS — E1165 Type 2 diabetes mellitus with hyperglycemia: Secondary | ICD-10-CM | POA: Diagnosis not present

## 2017-09-12 DIAGNOSIS — M0589 Other rheumatoid arthritis with rheumatoid factor of multiple sites: Secondary | ICD-10-CM | POA: Diagnosis not present

## 2017-09-12 DIAGNOSIS — Z79899 Other long term (current) drug therapy: Secondary | ICD-10-CM | POA: Diagnosis not present

## 2017-09-27 DIAGNOSIS — I10 Essential (primary) hypertension: Secondary | ICD-10-CM | POA: Diagnosis not present

## 2017-09-27 DIAGNOSIS — M1A0790 Idiopathic chronic gout, unspecified ankle and foot, without tophus (tophi): Secondary | ICD-10-CM | POA: Diagnosis not present

## 2017-09-27 DIAGNOSIS — E1165 Type 2 diabetes mellitus with hyperglycemia: Secondary | ICD-10-CM | POA: Diagnosis not present

## 2017-09-27 DIAGNOSIS — R7309 Other abnormal glucose: Secondary | ICD-10-CM | POA: Diagnosis not present

## 2017-10-04 DIAGNOSIS — I10 Essential (primary) hypertension: Secondary | ICD-10-CM | POA: Diagnosis not present

## 2017-10-04 DIAGNOSIS — G4733 Obstructive sleep apnea (adult) (pediatric): Secondary | ICD-10-CM | POA: Diagnosis not present

## 2017-10-04 DIAGNOSIS — E1165 Type 2 diabetes mellitus with hyperglycemia: Secondary | ICD-10-CM | POA: Diagnosis not present

## 2017-10-04 DIAGNOSIS — M069 Rheumatoid arthritis, unspecified: Secondary | ICD-10-CM | POA: Diagnosis not present

## 2017-10-10 DIAGNOSIS — M0589 Other rheumatoid arthritis with rheumatoid factor of multiple sites: Secondary | ICD-10-CM | POA: Diagnosis not present

## 2017-11-08 DIAGNOSIS — M0589 Other rheumatoid arthritis with rheumatoid factor of multiple sites: Secondary | ICD-10-CM | POA: Diagnosis not present

## 2017-12-04 DIAGNOSIS — E1165 Type 2 diabetes mellitus with hyperglycemia: Secondary | ICD-10-CM | POA: Diagnosis not present

## 2017-12-05 DIAGNOSIS — M150 Primary generalized (osteo)arthritis: Secondary | ICD-10-CM | POA: Diagnosis not present

## 2017-12-05 DIAGNOSIS — Z6838 Body mass index (BMI) 38.0-38.9, adult: Secondary | ICD-10-CM | POA: Diagnosis not present

## 2017-12-05 DIAGNOSIS — M1A09X0 Idiopathic chronic gout, multiple sites, without tophus (tophi): Secondary | ICD-10-CM | POA: Diagnosis not present

## 2017-12-05 DIAGNOSIS — M0589 Other rheumatoid arthritis with rheumatoid factor of multiple sites: Secondary | ICD-10-CM | POA: Diagnosis not present

## 2017-12-05 DIAGNOSIS — M25511 Pain in right shoulder: Secondary | ICD-10-CM | POA: Diagnosis not present

## 2017-12-05 DIAGNOSIS — M5136 Other intervertebral disc degeneration, lumbar region: Secondary | ICD-10-CM | POA: Diagnosis not present

## 2017-12-05 DIAGNOSIS — M25512 Pain in left shoulder: Secondary | ICD-10-CM | POA: Diagnosis not present

## 2017-12-05 DIAGNOSIS — Z79899 Other long term (current) drug therapy: Secondary | ICD-10-CM | POA: Diagnosis not present

## 2017-12-05 DIAGNOSIS — E669 Obesity, unspecified: Secondary | ICD-10-CM | POA: Diagnosis not present

## 2017-12-06 DIAGNOSIS — M0589 Other rheumatoid arthritis with rheumatoid factor of multiple sites: Secondary | ICD-10-CM | POA: Diagnosis not present

## 2018-01-08 DIAGNOSIS — M0589 Other rheumatoid arthritis with rheumatoid factor of multiple sites: Secondary | ICD-10-CM | POA: Diagnosis not present

## 2018-02-05 DIAGNOSIS — M0589 Other rheumatoid arthritis with rheumatoid factor of multiple sites: Secondary | ICD-10-CM | POA: Diagnosis not present

## 2018-03-12 DIAGNOSIS — M0589 Other rheumatoid arthritis with rheumatoid factor of multiple sites: Secondary | ICD-10-CM | POA: Diagnosis not present

## 2018-03-13 DIAGNOSIS — M0589 Other rheumatoid arthritis with rheumatoid factor of multiple sites: Secondary | ICD-10-CM | POA: Diagnosis not present

## 2018-03-13 DIAGNOSIS — M1A09X0 Idiopathic chronic gout, multiple sites, without tophus (tophi): Secondary | ICD-10-CM | POA: Diagnosis not present

## 2018-03-13 DIAGNOSIS — M5136 Other intervertebral disc degeneration, lumbar region: Secondary | ICD-10-CM | POA: Diagnosis not present

## 2018-03-13 DIAGNOSIS — M25512 Pain in left shoulder: Secondary | ICD-10-CM | POA: Diagnosis not present

## 2018-03-13 DIAGNOSIS — M25511 Pain in right shoulder: Secondary | ICD-10-CM | POA: Diagnosis not present

## 2018-03-13 DIAGNOSIS — M150 Primary generalized (osteo)arthritis: Secondary | ICD-10-CM | POA: Diagnosis not present

## 2018-03-13 DIAGNOSIS — E669 Obesity, unspecified: Secondary | ICD-10-CM | POA: Diagnosis not present

## 2018-03-13 DIAGNOSIS — Z79899 Other long term (current) drug therapy: Secondary | ICD-10-CM | POA: Diagnosis not present

## 2018-03-13 DIAGNOSIS — Z6839 Body mass index (BMI) 39.0-39.9, adult: Secondary | ICD-10-CM | POA: Diagnosis not present

## 2018-04-04 DIAGNOSIS — Z23 Encounter for immunization: Secondary | ICD-10-CM | POA: Diagnosis not present

## 2018-04-10 DIAGNOSIS — M0589 Other rheumatoid arthritis with rheumatoid factor of multiple sites: Secondary | ICD-10-CM | POA: Diagnosis not present

## 2018-04-11 DIAGNOSIS — E1165 Type 2 diabetes mellitus with hyperglycemia: Secondary | ICD-10-CM | POA: Diagnosis not present

## 2018-04-18 DIAGNOSIS — E1165 Type 2 diabetes mellitus with hyperglycemia: Secondary | ICD-10-CM | POA: Diagnosis not present

## 2018-04-18 DIAGNOSIS — M069 Rheumatoid arthritis, unspecified: Secondary | ICD-10-CM | POA: Diagnosis not present

## 2018-04-18 DIAGNOSIS — I10 Essential (primary) hypertension: Secondary | ICD-10-CM | POA: Diagnosis not present

## 2018-05-08 DIAGNOSIS — M0589 Other rheumatoid arthritis with rheumatoid factor of multiple sites: Secondary | ICD-10-CM | POA: Diagnosis not present

## 2018-05-08 DIAGNOSIS — Z79899 Other long term (current) drug therapy: Secondary | ICD-10-CM | POA: Diagnosis not present

## 2018-05-27 DIAGNOSIS — M25511 Pain in right shoulder: Secondary | ICD-10-CM | POA: Diagnosis not present

## 2018-05-27 DIAGNOSIS — Z6841 Body Mass Index (BMI) 40.0 and over, adult: Secondary | ICD-10-CM | POA: Diagnosis not present

## 2018-05-27 DIAGNOSIS — M5136 Other intervertebral disc degeneration, lumbar region: Secondary | ICD-10-CM | POA: Diagnosis not present

## 2018-05-27 DIAGNOSIS — M0589 Other rheumatoid arthritis with rheumatoid factor of multiple sites: Secondary | ICD-10-CM | POA: Diagnosis not present

## 2018-05-27 DIAGNOSIS — M1A09X0 Idiopathic chronic gout, multiple sites, without tophus (tophi): Secondary | ICD-10-CM | POA: Diagnosis not present

## 2018-05-27 DIAGNOSIS — Z79899 Other long term (current) drug therapy: Secondary | ICD-10-CM | POA: Diagnosis not present

## 2018-05-27 DIAGNOSIS — M25512 Pain in left shoulder: Secondary | ICD-10-CM | POA: Diagnosis not present

## 2018-05-27 DIAGNOSIS — M150 Primary generalized (osteo)arthritis: Secondary | ICD-10-CM | POA: Diagnosis not present

## 2018-06-05 DIAGNOSIS — M0589 Other rheumatoid arthritis with rheumatoid factor of multiple sites: Secondary | ICD-10-CM | POA: Diagnosis not present

## 2018-06-14 DIAGNOSIS — R972 Elevated prostate specific antigen [PSA]: Secondary | ICD-10-CM | POA: Diagnosis not present

## 2018-06-14 DIAGNOSIS — R351 Nocturia: Secondary | ICD-10-CM | POA: Diagnosis not present

## 2018-06-14 DIAGNOSIS — N401 Enlarged prostate with lower urinary tract symptoms: Secondary | ICD-10-CM | POA: Diagnosis not present

## 2018-06-20 IMAGING — CT CT HEART MORP W/ CTA COR W/ SCORE W/ CA W/CM &/OR W/O CM
4 of 7 series · 8 of 20 positions shown, 9 images · IV contrast (APPLIED)
Comparison: None.

CLINICAL DATA: Chest pain

EXAM:
Cardiac CTA
MEDICATIONS:
Sub lingual nitro. 4mg x 2
TECHNIQUE: The patient was scanned on a Siemens [REDACTED]ice scanner. Gantry
rotation speed was 240 msecs. Collimation was 0.6 mm. A 100 kV
prospective scan was triggered in the ascending thoracic aorta at
35-75% of the R-R interval. Average HR during the scan was 60 bpm.
The 3D data set was interpreted on a dedicated work station using
MPR, MIP and VRT modes. A total of 80cc of contrast was used.

[Series 6: best diast 69 % · axial · 0.39mm/px · z∈[+770,+809]mm · 2 of 298 slices shown, 3 images]
[im 100/298  vessel]
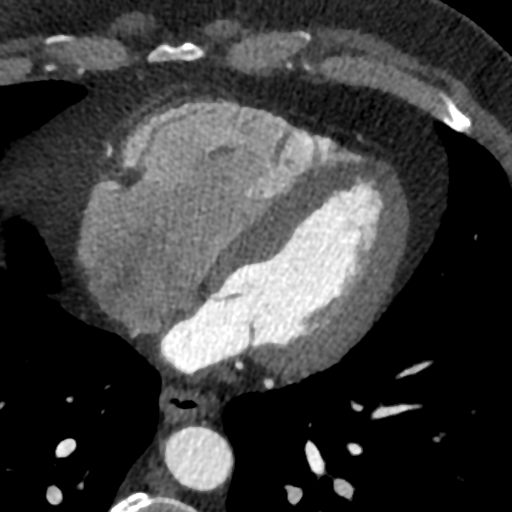
[im 100/298  lung]
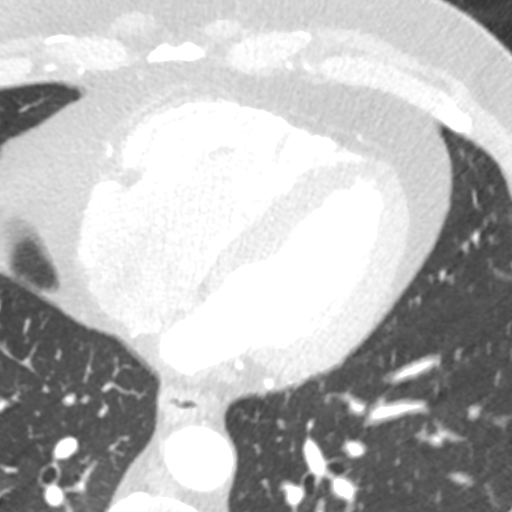
[im 199/298  vessel]
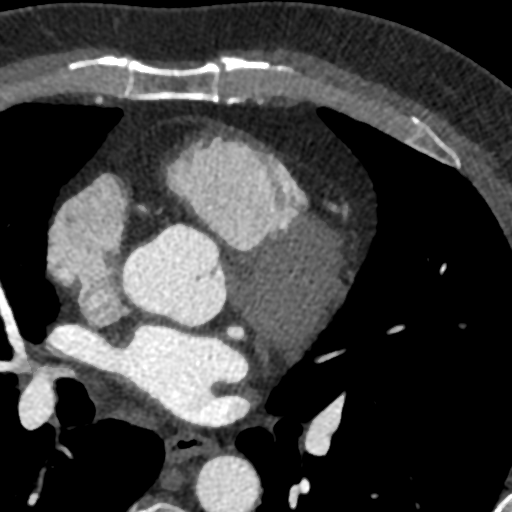

[Series 7: best syst 37 % · axial · 0.39mm/px · z∈[+770,+809]mm · 2 of 298 slices shown]
[im 100/298  vessel]
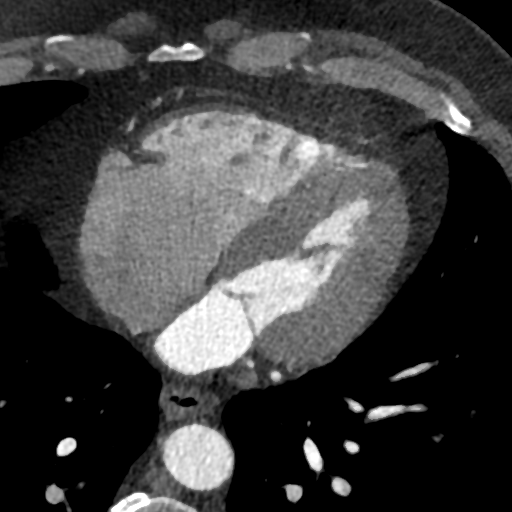
[im 199/298  vessel]
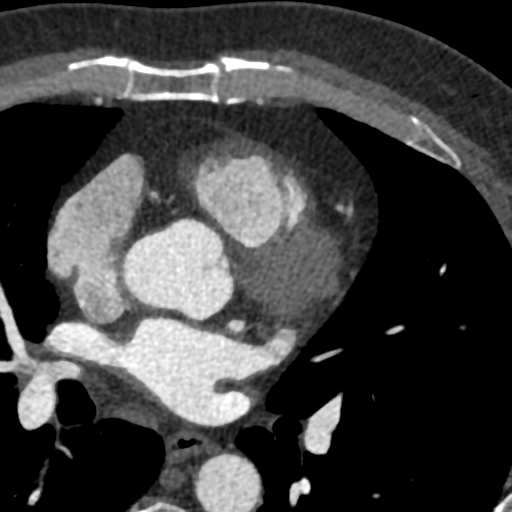

[Series 8: ts diast sharp 69 % · axial · 0.39mm/px · z∈[+770,+809]mm · 2 of 298 slices shown]
[im 100/298  lung]
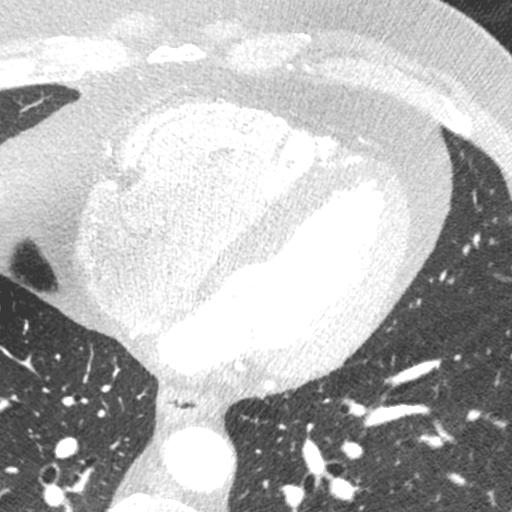
[im 199/298  lung]
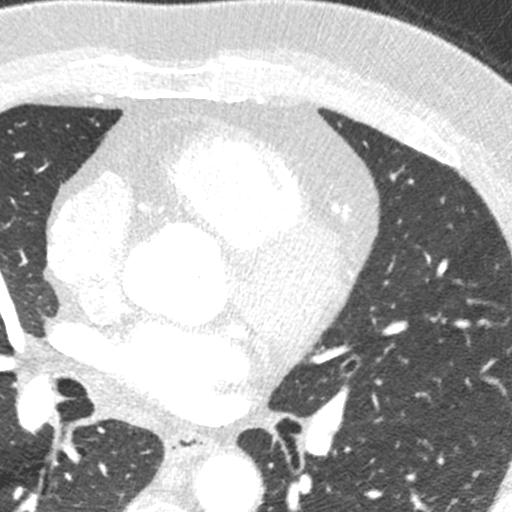

[Series 9: ts syst sharp 37 % · axial · 0.39mm/px · z∈[+770,+809]mm · 2 of 298 slices shown]
[im 100/298  lung]
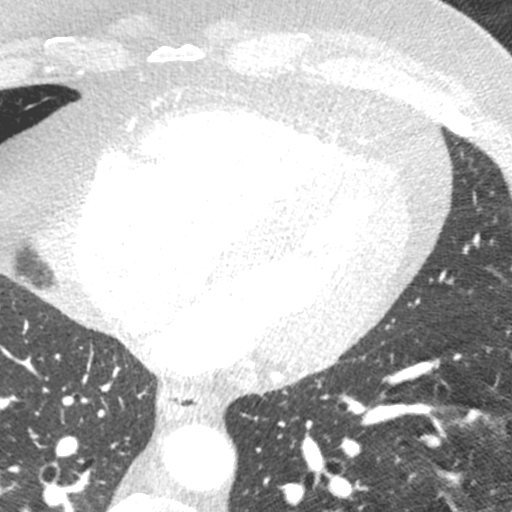
[im 199/298  lung]
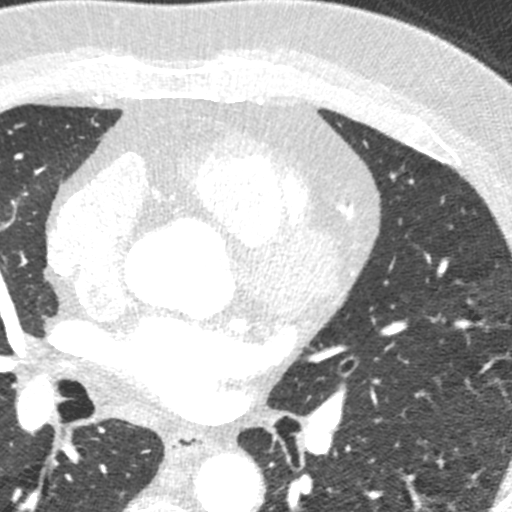

[8 of 20 positions shown; findings below may reference images not displayed]

FINDINGS: Non-cardiac: See separate report from [REDACTED].

Calcium Score: 104 Agatston units.

Coronary Arteries: Left dominant with no anomalies

LM: No plaque or stenosis.

LAD system: Mixed plaque in the proximal and mid LAD with no more
than mild stenosis.

Circumflex system: Large system, provides left-sided PDA. Large
PLOM. No plaque or stenosis in LCx system.

RCA system: Small, nondominant vessel.  No plaque or stenosis.
IMPRESSION: 1. Coronary artery calcium score 104 Agatston units, placing the
patient in the 48th percentile for age and gender. This suggests
intermediate risk for future cardiac events.

2.  Plaque in the proximal and mid LAD, no more than mild stenosis.

Sorin Oxendine

EXAM:
OVER-READ INTERPRETATION  CT CHEST

The following report is an over-read performed by radiologist Dr.
Shufan Pamplona [REDACTED] on 08/15/2017. This over-read
does not include interpretation of cardiac or coronary anatomy or
pathology. The coronary CTA interpretation by the cardiologist is
attached.
FINDINGS: Vascular: Heart is normal size.  Aorta is normal caliber.

Mediastinum/Nodes: No adenopathy in the lower mediastinum or hila.

Lungs/Pleura: Visualized lungs clear.  No effusions.

Upper Abdomen: Fatty infiltration of the liver.

Musculoskeletal: Chest wall soft tissues are unremarkable. No acute
bony abnormality.
IMPRESSION: Fatty infiltration of the liver.

No acute extra cardiac abnormality.

## 2018-07-03 ENCOUNTER — Ambulatory Visit (INDEPENDENT_AMBULATORY_CARE_PROVIDER_SITE_OTHER): Payer: Medicare Other | Admitting: Neurology

## 2018-07-03 DIAGNOSIS — G5601 Carpal tunnel syndrome, right upper limb: Secondary | ICD-10-CM

## 2018-07-03 DIAGNOSIS — Z0289 Encounter for other administrative examinations: Secondary | ICD-10-CM

## 2018-07-03 NOTE — Progress Notes (Signed)
EMG/NCS report is under procedure

## 2018-07-03 NOTE — Procedures (Signed)
Full Name: Collin King Gender: Male MRN #: [id number] Date of Birth: Mar 17, 1951    Visit Date: 07/03/2018 10:16 Age: 67 Years 1 Months Old Examining Physician: Floyde Parkins, MD  Referring Physician: Leigh Aurora, MD History: 67 year old male with history of rheumatoid arthritis, resented with 2 months history of right wrist pain, right hand and arm paresthesia  Examination: He has mild right abductor pollicis brevis and opponent weakness.  Mild decreased pinprick and light touch at right first 3 fingers.  Right wrist Tinel sign was present.  Summary of the tests:  Nerve conduction study: Right ulnar sensory and motor responses were normal.  Left median sensory and motor responses were normal.  Right median sensory responses showed moderately prolonged distal latency with moderately decreased to snap amplitude.  Right median motor responses showed moderately prolonged distal latency, with moderately decreased the C map amplitude.  Electromyography: Selected needle examination of the right upper extremity muscle were performed. The only abnormality is mildly increased insertional activity at right abductor pollicis brevis with mildly decreased recruitment patterns.  Conclusion: This is an abnormal study.  There is electrodiagnostic evidence of right median neuropathy across the wrist consistent with right carpal tunnel syndromes, moderately severe, demyelinating nature, no evidence of axonal loss.  He would benefit conservative treatment at this point, I have recommended as needed NSAIDs, wrist splint.    ------------------------------- Marcial Pacas, M.D. PhD  [facility] [place], [place] [address] Tel: [phone] Fax: [phone]        [facility]    Nerve / Sites Muscle Latency Ref. Amplitude Ref. Rel Amp Segments Distance Velocity Ref. Area    ms ms mV mV %  cm m/s m/s mVms  R Median - APB     Wrist APB 6.7 ?4.4 2.7 ?4.0 100 Wrist - APB  7   11.7     Upper arm APB 11.6  0.8  30.7 Upper arm - Wrist 22 45 ?49 4.5  L Median - APB     Wrist APB 3.8 ?4.4 6.0 ?4.0 100 Wrist - APB 7   19.9     Upper arm APB 8.1  5.4  88.9 Upper arm - Wrist 23 53 ?49 20.3  R Ulnar - ADM     Wrist ADM 2.7 ?3.3 10.3 ?6.0 100 Wrist - ADM 7   34.2     B.Elbow ADM 6.4  9.4  91.2 B.Elbow - Wrist 19 51 ?49 33.1     A.Elbow ADM 8.3  9.0  96.1 A.Elbow - B.Elbow 10 51 ?49 33.0         A.Elbow - Wrist               SNC    Nerve / Sites Rec. Site Peak Lat Ref.  Amp Ref. Segments Distance    ms ms V V  cm  R Median - Orthodromic (Dig II, Mid palm)     Dig II Wrist 4.8 ?3.4 5 ?10 Dig II - Wrist 13  L Median - Orthodromic (Dig II, Mid palm)     Dig II Wrist 3.4 ?3.4 13 ?10 Dig II - Wrist 13  R Ulnar - Orthodromic, (Dig V, Mid palm)     Dig V Wrist 3.0 ?3.1 7 ?5 Dig V - Wrist 69            F  Wave    Nerve F Lat Ref.   ms ms  R Ulnar - ADM  29.4 ?32.0       EMG full       EMG Summary Table    Spontaneous MUAP Recruitment  Muscle IA Fib PSW Fasc Other Amp Dur. Poly Pattern  R. First dorsal interosseous Normal None None None _______ Normal Normal Normal Normal  R. Abductor pollicis brevis Increased None None None _______ Increased Normal Normal Reduced  R. Pronator teres Normal None None None _______ Normal Normal Normal Normal  R. Biceps brachii Normal None None None _______ Normal Normal Normal Normal  R. Deltoid Normal None None None _______ Normal Normal Normal Normal

## 2018-07-04 DIAGNOSIS — M0589 Other rheumatoid arthritis with rheumatoid factor of multiple sites: Secondary | ICD-10-CM | POA: Diagnosis not present

## 2018-07-31 DIAGNOSIS — M150 Primary generalized (osteo)arthritis: Secondary | ICD-10-CM | POA: Diagnosis not present

## 2018-07-31 DIAGNOSIS — M25512 Pain in left shoulder: Secondary | ICD-10-CM | POA: Diagnosis not present

## 2018-07-31 DIAGNOSIS — Z6841 Body Mass Index (BMI) 40.0 and over, adult: Secondary | ICD-10-CM | POA: Diagnosis not present

## 2018-07-31 DIAGNOSIS — M0589 Other rheumatoid arthritis with rheumatoid factor of multiple sites: Secondary | ICD-10-CM | POA: Diagnosis not present

## 2018-07-31 DIAGNOSIS — Z79899 Other long term (current) drug therapy: Secondary | ICD-10-CM | POA: Diagnosis not present

## 2018-07-31 DIAGNOSIS — M1A09X0 Idiopathic chronic gout, multiple sites, without tophus (tophi): Secondary | ICD-10-CM | POA: Diagnosis not present

## 2018-07-31 DIAGNOSIS — M5136 Other intervertebral disc degeneration, lumbar region: Secondary | ICD-10-CM | POA: Diagnosis not present

## 2018-07-31 DIAGNOSIS — M7989 Other specified soft tissue disorders: Secondary | ICD-10-CM | POA: Diagnosis not present

## 2018-07-31 DIAGNOSIS — M25511 Pain in right shoulder: Secondary | ICD-10-CM | POA: Diagnosis not present

## 2018-08-01 DIAGNOSIS — M0589 Other rheumatoid arthritis with rheumatoid factor of multiple sites: Secondary | ICD-10-CM | POA: Diagnosis not present

## 2018-08-09 DIAGNOSIS — H2513 Age-related nuclear cataract, bilateral: Secondary | ICD-10-CM | POA: Diagnosis not present

## 2018-08-29 DIAGNOSIS — M0589 Other rheumatoid arthritis with rheumatoid factor of multiple sites: Secondary | ICD-10-CM | POA: Diagnosis not present

## 2018-09-13 DIAGNOSIS — Z79899 Other long term (current) drug therapy: Secondary | ICD-10-CM | POA: Diagnosis not present

## 2018-09-13 DIAGNOSIS — M25512 Pain in left shoulder: Secondary | ICD-10-CM | POA: Diagnosis not present

## 2018-09-13 DIAGNOSIS — Z6841 Body Mass Index (BMI) 40.0 and over, adult: Secondary | ICD-10-CM | POA: Diagnosis not present

## 2018-09-13 DIAGNOSIS — M150 Primary generalized (osteo)arthritis: Secondary | ICD-10-CM | POA: Diagnosis not present

## 2018-09-13 DIAGNOSIS — M5136 Other intervertebral disc degeneration, lumbar region: Secondary | ICD-10-CM | POA: Diagnosis not present

## 2018-09-13 DIAGNOSIS — M1A09X0 Idiopathic chronic gout, multiple sites, without tophus (tophi): Secondary | ICD-10-CM | POA: Diagnosis not present

## 2018-09-13 DIAGNOSIS — M0589 Other rheumatoid arthritis with rheumatoid factor of multiple sites: Secondary | ICD-10-CM | POA: Diagnosis not present

## 2018-09-13 DIAGNOSIS — M25511 Pain in right shoulder: Secondary | ICD-10-CM | POA: Diagnosis not present

## 2018-09-20 DIAGNOSIS — Z125 Encounter for screening for malignant neoplasm of prostate: Secondary | ICD-10-CM | POA: Diagnosis not present

## 2018-09-20 DIAGNOSIS — I10 Essential (primary) hypertension: Secondary | ICD-10-CM | POA: Diagnosis not present

## 2018-09-20 DIAGNOSIS — E1165 Type 2 diabetes mellitus with hyperglycemia: Secondary | ICD-10-CM | POA: Diagnosis not present

## 2018-09-27 DIAGNOSIS — R002 Palpitations: Secondary | ICD-10-CM | POA: Diagnosis not present

## 2018-09-27 DIAGNOSIS — M1A0790 Idiopathic chronic gout, unspecified ankle and foot, without tophus (tophi): Secondary | ICD-10-CM | POA: Diagnosis not present

## 2018-09-27 DIAGNOSIS — I10 Essential (primary) hypertension: Secondary | ICD-10-CM | POA: Diagnosis not present

## 2018-09-27 DIAGNOSIS — G44219 Episodic tension-type headache, not intractable: Secondary | ICD-10-CM | POA: Diagnosis not present

## 2018-09-27 DIAGNOSIS — R972 Elevated prostate specific antigen [PSA]: Secondary | ICD-10-CM | POA: Diagnosis not present

## 2018-09-27 DIAGNOSIS — E1165 Type 2 diabetes mellitus with hyperglycemia: Secondary | ICD-10-CM | POA: Diagnosis not present

## 2018-09-27 DIAGNOSIS — M069 Rheumatoid arthritis, unspecified: Secondary | ICD-10-CM | POA: Diagnosis not present

## 2018-09-27 DIAGNOSIS — R001 Bradycardia, unspecified: Secondary | ICD-10-CM | POA: Diagnosis not present

## 2018-11-06 DIAGNOSIS — M5136 Other intervertebral disc degeneration, lumbar region: Secondary | ICD-10-CM | POA: Diagnosis not present

## 2018-11-06 DIAGNOSIS — M0589 Other rheumatoid arthritis with rheumatoid factor of multiple sites: Secondary | ICD-10-CM | POA: Diagnosis not present

## 2018-11-06 DIAGNOSIS — M25512 Pain in left shoulder: Secondary | ICD-10-CM | POA: Diagnosis not present

## 2018-11-06 DIAGNOSIS — M25511 Pain in right shoulder: Secondary | ICD-10-CM | POA: Diagnosis not present

## 2018-11-06 DIAGNOSIS — Z79899 Other long term (current) drug therapy: Secondary | ICD-10-CM | POA: Diagnosis not present

## 2018-11-06 DIAGNOSIS — M1A09X0 Idiopathic chronic gout, multiple sites, without tophus (tophi): Secondary | ICD-10-CM | POA: Diagnosis not present

## 2018-11-06 DIAGNOSIS — M150 Primary generalized (osteo)arthritis: Secondary | ICD-10-CM | POA: Diagnosis not present

## 2018-11-12 DIAGNOSIS — M0589 Other rheumatoid arthritis with rheumatoid factor of multiple sites: Secondary | ICD-10-CM | POA: Diagnosis not present

## 2018-11-15 ENCOUNTER — Ambulatory Visit: Payer: Medicare Other | Admitting: Cardiology

## 2018-11-26 DIAGNOSIS — M0589 Other rheumatoid arthritis with rheumatoid factor of multiple sites: Secondary | ICD-10-CM | POA: Diagnosis not present

## 2018-12-11 DIAGNOSIS — M0589 Other rheumatoid arthritis with rheumatoid factor of multiple sites: Secondary | ICD-10-CM | POA: Diagnosis not present

## 2019-01-03 DIAGNOSIS — I10 Essential (primary) hypertension: Secondary | ICD-10-CM | POA: Diagnosis not present

## 2019-01-03 DIAGNOSIS — E1165 Type 2 diabetes mellitus with hyperglycemia: Secondary | ICD-10-CM | POA: Diagnosis not present

## 2019-01-08 DIAGNOSIS — M0589 Other rheumatoid arthritis with rheumatoid factor of multiple sites: Secondary | ICD-10-CM | POA: Diagnosis not present

## 2019-01-10 DIAGNOSIS — M1A0790 Idiopathic chronic gout, unspecified ankle and foot, without tophus (tophi): Secondary | ICD-10-CM | POA: Diagnosis not present

## 2019-01-10 DIAGNOSIS — E1165 Type 2 diabetes mellitus with hyperglycemia: Secondary | ICD-10-CM | POA: Diagnosis not present

## 2019-01-10 DIAGNOSIS — I10 Essential (primary) hypertension: Secondary | ICD-10-CM | POA: Diagnosis not present

## 2019-01-10 DIAGNOSIS — Z7189 Other specified counseling: Secondary | ICD-10-CM | POA: Diagnosis not present

## 2019-02-05 DIAGNOSIS — M0589 Other rheumatoid arthritis with rheumatoid factor of multiple sites: Secondary | ICD-10-CM | POA: Diagnosis not present

## 2019-02-12 DIAGNOSIS — Z6837 Body mass index (BMI) 37.0-37.9, adult: Secondary | ICD-10-CM | POA: Diagnosis not present

## 2019-02-12 DIAGNOSIS — M0589 Other rheumatoid arthritis with rheumatoid factor of multiple sites: Secondary | ICD-10-CM | POA: Diagnosis not present

## 2019-02-12 DIAGNOSIS — E669 Obesity, unspecified: Secondary | ICD-10-CM | POA: Diagnosis not present

## 2019-02-12 DIAGNOSIS — M5136 Other intervertebral disc degeneration, lumbar region: Secondary | ICD-10-CM | POA: Diagnosis not present

## 2019-02-12 DIAGNOSIS — M1A09X0 Idiopathic chronic gout, multiple sites, without tophus (tophi): Secondary | ICD-10-CM | POA: Diagnosis not present

## 2019-02-12 DIAGNOSIS — M150 Primary generalized (osteo)arthritis: Secondary | ICD-10-CM | POA: Diagnosis not present

## 2019-02-12 DIAGNOSIS — Z79899 Other long term (current) drug therapy: Secondary | ICD-10-CM | POA: Diagnosis not present

## 2019-02-12 DIAGNOSIS — M25511 Pain in right shoulder: Secondary | ICD-10-CM | POA: Diagnosis not present

## 2019-02-12 DIAGNOSIS — M25512 Pain in left shoulder: Secondary | ICD-10-CM | POA: Diagnosis not present

## 2019-02-21 ENCOUNTER — Other Ambulatory Visit: Payer: Self-pay

## 2019-03-05 DIAGNOSIS — M0589 Other rheumatoid arthritis with rheumatoid factor of multiple sites: Secondary | ICD-10-CM | POA: Diagnosis not present

## 2019-04-02 DIAGNOSIS — M0589 Other rheumatoid arthritis with rheumatoid factor of multiple sites: Secondary | ICD-10-CM | POA: Diagnosis not present

## 2019-04-24 DIAGNOSIS — Z23 Encounter for immunization: Secondary | ICD-10-CM | POA: Diagnosis not present

## 2019-04-30 DIAGNOSIS — M0589 Other rheumatoid arthritis with rheumatoid factor of multiple sites: Secondary | ICD-10-CM | POA: Diagnosis not present

## 2019-05-28 DIAGNOSIS — M0589 Other rheumatoid arthritis with rheumatoid factor of multiple sites: Secondary | ICD-10-CM | POA: Diagnosis not present

## 2019-06-06 DIAGNOSIS — E1165 Type 2 diabetes mellitus with hyperglycemia: Secondary | ICD-10-CM | POA: Diagnosis not present

## 2019-06-06 DIAGNOSIS — I10 Essential (primary) hypertension: Secondary | ICD-10-CM | POA: Diagnosis not present

## 2019-06-10 DIAGNOSIS — M0589 Other rheumatoid arthritis with rheumatoid factor of multiple sites: Secondary | ICD-10-CM | POA: Diagnosis not present

## 2019-06-10 DIAGNOSIS — M25511 Pain in right shoulder: Secondary | ICD-10-CM | POA: Diagnosis not present

## 2019-06-10 DIAGNOSIS — Z6838 Body mass index (BMI) 38.0-38.9, adult: Secondary | ICD-10-CM | POA: Diagnosis not present

## 2019-06-10 DIAGNOSIS — E669 Obesity, unspecified: Secondary | ICD-10-CM | POA: Diagnosis not present

## 2019-06-10 DIAGNOSIS — M25512 Pain in left shoulder: Secondary | ICD-10-CM | POA: Diagnosis not present

## 2019-06-10 DIAGNOSIS — M150 Primary generalized (osteo)arthritis: Secondary | ICD-10-CM | POA: Diagnosis not present

## 2019-06-10 DIAGNOSIS — M1A09X0 Idiopathic chronic gout, multiple sites, without tophus (tophi): Secondary | ICD-10-CM | POA: Diagnosis not present

## 2019-06-10 DIAGNOSIS — Z79899 Other long term (current) drug therapy: Secondary | ICD-10-CM | POA: Diagnosis not present

## 2019-06-10 DIAGNOSIS — M5136 Other intervertebral disc degeneration, lumbar region: Secondary | ICD-10-CM | POA: Diagnosis not present

## 2019-06-13 DIAGNOSIS — E1165 Type 2 diabetes mellitus with hyperglycemia: Secondary | ICD-10-CM | POA: Diagnosis not present

## 2019-06-13 DIAGNOSIS — M1A0790 Idiopathic chronic gout, unspecified ankle and foot, without tophus (tophi): Secondary | ICD-10-CM | POA: Diagnosis not present

## 2019-06-13 DIAGNOSIS — M069 Rheumatoid arthritis, unspecified: Secondary | ICD-10-CM | POA: Diagnosis not present

## 2019-06-25 DIAGNOSIS — M0589 Other rheumatoid arthritis with rheumatoid factor of multiple sites: Secondary | ICD-10-CM | POA: Diagnosis not present

## 2019-07-23 DIAGNOSIS — R972 Elevated prostate specific antigen [PSA]: Secondary | ICD-10-CM | POA: Diagnosis not present

## 2019-07-29 DIAGNOSIS — M0589 Other rheumatoid arthritis with rheumatoid factor of multiple sites: Secondary | ICD-10-CM | POA: Diagnosis not present

## 2019-07-30 DIAGNOSIS — N401 Enlarged prostate with lower urinary tract symptoms: Secondary | ICD-10-CM | POA: Diagnosis not present

## 2019-07-30 DIAGNOSIS — R350 Frequency of micturition: Secondary | ICD-10-CM | POA: Diagnosis not present

## 2019-07-30 DIAGNOSIS — R972 Elevated prostate specific antigen [PSA]: Secondary | ICD-10-CM | POA: Diagnosis not present

## 2019-08-26 DIAGNOSIS — M0589 Other rheumatoid arthritis with rheumatoid factor of multiple sites: Secondary | ICD-10-CM | POA: Diagnosis not present

## 2019-08-26 DIAGNOSIS — Z79899 Other long term (current) drug therapy: Secondary | ICD-10-CM | POA: Diagnosis not present

## 2019-09-17 DIAGNOSIS — H2513 Age-related nuclear cataract, bilateral: Secondary | ICD-10-CM | POA: Diagnosis not present

## 2019-09-24 DIAGNOSIS — M0589 Other rheumatoid arthritis with rheumatoid factor of multiple sites: Secondary | ICD-10-CM | POA: Diagnosis not present

## 2019-10-07 DIAGNOSIS — Z6838 Body mass index (BMI) 38.0-38.9, adult: Secondary | ICD-10-CM | POA: Diagnosis not present

## 2019-10-07 DIAGNOSIS — M25512 Pain in left shoulder: Secondary | ICD-10-CM | POA: Diagnosis not present

## 2019-10-07 DIAGNOSIS — E669 Obesity, unspecified: Secondary | ICD-10-CM | POA: Diagnosis not present

## 2019-10-07 DIAGNOSIS — M5136 Other intervertebral disc degeneration, lumbar region: Secondary | ICD-10-CM | POA: Diagnosis not present

## 2019-10-07 DIAGNOSIS — M150 Primary generalized (osteo)arthritis: Secondary | ICD-10-CM | POA: Diagnosis not present

## 2019-10-07 DIAGNOSIS — M0589 Other rheumatoid arthritis with rheumatoid factor of multiple sites: Secondary | ICD-10-CM | POA: Diagnosis not present

## 2019-10-07 DIAGNOSIS — M1A09X0 Idiopathic chronic gout, multiple sites, without tophus (tophi): Secondary | ICD-10-CM | POA: Diagnosis not present

## 2019-10-07 DIAGNOSIS — Z79899 Other long term (current) drug therapy: Secondary | ICD-10-CM | POA: Diagnosis not present

## 2019-10-07 DIAGNOSIS — M25511 Pain in right shoulder: Secondary | ICD-10-CM | POA: Diagnosis not present

## 2019-10-10 DIAGNOSIS — M069 Rheumatoid arthritis, unspecified: Secondary | ICD-10-CM | POA: Diagnosis not present

## 2019-10-10 DIAGNOSIS — I10 Essential (primary) hypertension: Secondary | ICD-10-CM | POA: Diagnosis not present

## 2019-10-10 DIAGNOSIS — R05 Cough: Secondary | ICD-10-CM | POA: Diagnosis not present

## 2019-10-10 DIAGNOSIS — Z79899 Other long term (current) drug therapy: Secondary | ICD-10-CM | POA: Diagnosis not present

## 2019-10-10 DIAGNOSIS — M1A0790 Idiopathic chronic gout, unspecified ankle and foot, without tophus (tophi): Secondary | ICD-10-CM | POA: Diagnosis not present

## 2019-10-10 DIAGNOSIS — R0781 Pleurodynia: Secondary | ICD-10-CM | POA: Diagnosis not present

## 2019-10-10 DIAGNOSIS — E1165 Type 2 diabetes mellitus with hyperglycemia: Secondary | ICD-10-CM | POA: Diagnosis not present

## 2019-10-10 DIAGNOSIS — Z0000 Encounter for general adult medical examination without abnormal findings: Secondary | ICD-10-CM | POA: Diagnosis not present

## 2019-10-22 DIAGNOSIS — M0589 Other rheumatoid arthritis with rheumatoid factor of multiple sites: Secondary | ICD-10-CM | POA: Diagnosis not present

## 2019-11-21 DIAGNOSIS — M0589 Other rheumatoid arthritis with rheumatoid factor of multiple sites: Secondary | ICD-10-CM | POA: Diagnosis not present

## 2020-01-16 DIAGNOSIS — M0589 Other rheumatoid arthritis with rheumatoid factor of multiple sites: Secondary | ICD-10-CM | POA: Diagnosis not present

## 2020-02-11 DIAGNOSIS — M1A0790 Idiopathic chronic gout, unspecified ankle and foot, without tophus (tophi): Secondary | ICD-10-CM | POA: Diagnosis not present

## 2020-02-11 DIAGNOSIS — M069 Rheumatoid arthritis, unspecified: Secondary | ICD-10-CM | POA: Diagnosis not present

## 2020-02-11 DIAGNOSIS — I10 Essential (primary) hypertension: Secondary | ICD-10-CM | POA: Diagnosis not present

## 2020-02-11 DIAGNOSIS — E1165 Type 2 diabetes mellitus with hyperglycemia: Secondary | ICD-10-CM | POA: Diagnosis not present

## 2020-02-24 DIAGNOSIS — M0589 Other rheumatoid arthritis with rheumatoid factor of multiple sites: Secondary | ICD-10-CM | POA: Diagnosis not present

## 2020-02-24 DIAGNOSIS — Z79899 Other long term (current) drug therapy: Secondary | ICD-10-CM | POA: Diagnosis not present

## 2020-03-03 ENCOUNTER — Emergency Department (HOSPITAL_COMMUNITY)
Admission: EM | Admit: 2020-03-03 | Discharge: 2020-03-03 | Disposition: A | Discharge status: Home / Self Care | Payer: Medicare Other | Attending: Emergency Medicine | Admitting: Emergency Medicine

## 2020-03-03 ENCOUNTER — Encounter (HOSPITAL_COMMUNITY): Payer: Self-pay

## 2020-03-03 ENCOUNTER — Other Ambulatory Visit: Payer: Self-pay

## 2020-03-03 DIAGNOSIS — R42 Dizziness and giddiness: Secondary | ICD-10-CM | POA: Insufficient documentation

## 2020-03-03 DIAGNOSIS — R21 Rash and other nonspecific skin eruption: Secondary | ICD-10-CM | POA: Insufficient documentation

## 2020-03-03 DIAGNOSIS — I4510 Unspecified right bundle-branch block: Secondary | ICD-10-CM | POA: Diagnosis not present

## 2020-03-03 DIAGNOSIS — R0689 Other abnormalities of breathing: Secondary | ICD-10-CM | POA: Diagnosis not present

## 2020-03-03 DIAGNOSIS — R110 Nausea: Secondary | ICD-10-CM | POA: Diagnosis not present

## 2020-03-03 DIAGNOSIS — T782XXA Anaphylactic shock, unspecified, initial encounter: Secondary | ICD-10-CM | POA: Diagnosis not present

## 2020-03-03 DIAGNOSIS — Z79899 Other long term (current) drug therapy: Secondary | ICD-10-CM | POA: Insufficient documentation

## 2020-03-03 DIAGNOSIS — Z7984 Long term (current) use of oral hypoglycemic drugs: Secondary | ICD-10-CM | POA: Insufficient documentation

## 2020-03-03 DIAGNOSIS — E119 Type 2 diabetes mellitus without complications: Secondary | ICD-10-CM | POA: Diagnosis not present

## 2020-03-03 DIAGNOSIS — R069 Unspecified abnormalities of breathing: Secondary | ICD-10-CM | POA: Diagnosis not present

## 2020-03-03 DIAGNOSIS — I10 Essential (primary) hypertension: Secondary | ICD-10-CM | POA: Insufficient documentation

## 2020-03-03 DIAGNOSIS — R0902 Hypoxemia: Secondary | ICD-10-CM | POA: Diagnosis not present

## 2020-03-03 DIAGNOSIS — R55 Syncope and collapse: Secondary | ICD-10-CM | POA: Diagnosis not present

## 2020-03-03 DIAGNOSIS — T7840XA Allergy, unspecified, initial encounter: Secondary | ICD-10-CM | POA: Diagnosis present

## 2020-03-03 LAB — CBC WITH DIFFERENTIAL/PLATELET
Abs Immature Granulocytes: 0.04 10*3/uL (ref 0.00–0.07)
Basophils Absolute: 0 10*3/uL (ref 0.0–0.1)
Basophils Relative: 0 %
Eosinophils Absolute: 0.1 10*3/uL (ref 0.0–0.5)
Eosinophils Relative: 1 %
HCT: 50.7 % (ref 39.0–52.0)
Hemoglobin: 15.7 g/dL (ref 13.0–17.0)
Immature Granulocytes: 0 %
Lymphocytes Relative: 22 %
Lymphs Abs: 2.6 10*3/uL (ref 0.7–4.0)
MCH: 28.5 pg (ref 26.0–34.0)
MCHC: 31 g/dL (ref 30.0–36.0)
MCV: 92 fL (ref 80.0–100.0)
Monocytes Absolute: 0.9 10*3/uL (ref 0.1–1.0)
Monocytes Relative: 7 %
Neutro Abs: 8.5 10*3/uL — ABNORMAL HIGH (ref 1.7–7.7)
Neutrophils Relative %: 70 %
Platelets: 143 10*3/uL — ABNORMAL LOW (ref 150–400)
RBC: 5.51 MIL/uL (ref 4.22–5.81)
RDW: 13.9 % (ref 11.5–15.5)
WBC: 12.1 10*3/uL — ABNORMAL HIGH (ref 4.0–10.5)
nRBC: 0 % (ref 0.0–0.2)

## 2020-03-03 LAB — COMPREHENSIVE METABOLIC PANEL
ALT: 28 U/L (ref 0–44)
AST: 45 U/L — ABNORMAL HIGH (ref 15–41)
Albumin: 3.4 g/dL — ABNORMAL LOW (ref 3.5–5.0)
Alkaline Phosphatase: 52 U/L (ref 38–126)
Anion gap: 20 — ABNORMAL HIGH (ref 5–15)
BUN: 18 mg/dL (ref 8–23)
CO2: 16 mmol/L — ABNORMAL LOW (ref 22–32)
Calcium: 9.2 mg/dL (ref 8.9–10.3)
Chloride: 103 mmol/L (ref 98–111)
Creatinine, Ser: 1.34 mg/dL — ABNORMAL HIGH (ref 0.61–1.24)
GFR calc Af Amer: >60 mL/min (ref >60)
GFR calc non Af Amer: 53 mL/min — ABNORMAL LOW (ref >60)
Glucose, Bld: 260 mg/dL — ABNORMAL HIGH (ref 70–99)
Potassium: 4.7 mmol/L (ref 3.5–5.1)
Sodium: 139 mmol/L (ref 135–145)
Total Bilirubin: 1.5 mg/dL — ABNORMAL HIGH (ref 0.3–1.2)
Total Protein: 5.8 g/dL — ABNORMAL LOW (ref 6.5–8.1)

## 2020-03-03 LAB — LIPASE, BLOOD: Lipase: 56 U/L — ABNORMAL HIGH (ref 11–51)

## 2020-03-03 MED ORDER — EPINEPHRINE 0.3 MG/0.3ML IJ SOAJ: 0.3000 mg | INTRAMUSCULAR | 0 refills | Status: AC | PRN

## 2020-03-03 MED ORDER — METHYLPREDNISOLONE SODIUM SUCC 125 MG IJ SOLR
125.0000 mg | Freq: Once | INTRAMUSCULAR | Status: AC
  Administered 2020-03-03: 125 mg via INTRAVENOUS
  Filled 2020-03-03: qty 2

## 2020-03-03 MED ORDER — PREDNISONE 20 MG PO TABS
40.0000 mg | ORAL_TABLET | Freq: Every day | ORAL | 0 refills | Status: AC
Start: 2020-03-03 — End: 2020-03-08

## 2020-03-03 MED ORDER — DIPHENHYDRAMINE HCL 50 MG/ML IJ SOLN
50.0000 mg | Freq: Once | INTRAMUSCULAR | Status: AC
  Administered 2020-03-03: 50 mg via INTRAVENOUS
  Filled 2020-03-03: qty 1

## 2020-03-03 MED ORDER — FAMOTIDINE IN NACL 20-0.9 MG/50ML-% IV SOLN
20.0000 mg | Freq: Once | INTRAVENOUS | Status: AC
Start: 2020-03-03 — End: 2020-03-03
  Administered 2020-03-03: 20 mg via INTRAVENOUS
  Filled 2020-03-03: qty 50

## 2020-03-03 NOTE — Discharge Instructions (Addendum)
I have prescribed a short course of steroids in order to help with decrease of systemic reaction.  Please take 2 tablets once a day for the next 5 days.  Please be aware steroids can cause insomnia, appetite changes, flushness.  I have also provided prescription for an EpiPen, please have this near you if you experience another encounter with bee venom.

## 2020-03-03 NOTE — ED Notes (Signed)
Collin King [phone] son

## 2020-03-03 NOTE — ED Provider Notes (Signed)
[facility] EMERGENCY DEPARTMENT Provider Note   CSN: [id number] Arrival date & time: 03/03/20  1651     History Chief Complaint  Patient presents with  . Allergic Reaction    Collin King is a 69 y.o. male.  69 y.o male with a PMH of DM, HTN, presents to the ED via EMS s/p allergic reaction.  Patient was working in the yard, got stung several times by bees along with yellow jackets.  Initially found diaphoretic by EMS, no radial pulses, was given 2 EpiPen's and started on a drip at 2 mics per minute for approximately 8 minutes.  He was found hypotensive however pressure then improved.  He does not have any prior history of anaphylaxis to bee venom.  He also received 750 mL bolus.  Patient is also endorsing some nausea but no abdominal pain. He arrived in no respiratory distress. No chest pain, no shortness of breath, tolerating his secretions.   The history is provided by the patient and medical records.  Allergic Reaction Presenting symptoms: difficulty breathing and rash   Difficulty breathing:    Severity:  Moderate   Onset quality:  Sudden   Duration:  1 hour   Timing:  Constant   Progression:  Improving Severity:  Mild Prior allergic episodes:  No prior episodes Relieved by:  Epinephrine      Past Medical History:  Diagnosis Date  . Arthritis   . Diabetes mellitus, type II ([place]) 05/2017   A1c 7.3  . Gout   . History of sleep apnea 2004   Status post surgery  . Hypertension   . Rheumatoid arthritis involving both knees [facility])     Patient Active Problem List   Diagnosis Date Noted  . Dyspnea on exertion 06/29/2017  . Edema of both legs 06/29/2017  . Family history of premature coronary artery disease 06/29/2017  . OSA (obstructive sleep apnea) 06/29/2017  . Diabetes mellitus type II, non insulin dependent ([place]) 06/29/2017  . Essential hypertension 06/29/2017  . Morbid obesity ([place]) 06/29/2017  . Chest pain on breathing 06/29/2017    Past  Surgical History:  Procedure Laterality Date  . HERNIA REPAIR    . Sleep apnea surgery  2004  . THORACOSCOPY W/ THORACIC SYMPATHECTOMY     T2-T3  . WRIST SURGERY  1977   Status post prosthesis       Family History  Problem Relation Age of Onset  . Diabetes Mellitus II Father   . Heart attack Father        Total of 3 heart attacks.  Marland Kitchen CAD Father        With ischemic cardia myopathy  . Coronary artery disease Paternal Uncle        2 out of 63 paternal aunts and has had CAD    Social History   Tobacco Use  . Smoking status: Never Smoker  . Smokeless tobacco: Never Used  Substance Use Topics  . Alcohol use: No  . Drug use: No    Home Medications Prior to Admission medications   Medication Sig Start Date End Date Taking? Authorizing Provider  Abatacept (ORENCIA IV) Inject into the vein every 30 (thirty) days.   Yes [provider]  alfuzosin (UROXATRAL) 10 MG 24 hr tablet Take 10 mg by mouth daily. 05/31/15  Yes [provider]  allopurinol (ZYLOPRIM) 100 MG tablet Take 200 mg by mouth in the morning.  06/01/15  Yes [provider]  hydrochlorothiazide (HYDRODIURIL) 12.5 MG tablet  Take 12.5 mg by mouth daily. 05/31/15  Yes [provider]  HYDROcodone-acetaminophen (NORCO/VICODIN) 5-325 MG tablet Take 1 tablet by mouth 2 (two) times daily as needed (for pain).   Yes [provider]  ibuprofen (ADVIL) 800 MG tablet Take 800 mg by mouth daily as needed for headache or mild pain.    Yes [provider]  metFORMIN (GLUCOPHAGE) 500 MG tablet Take 1,000 mg by mouth 2 (two) times daily with a meal.   Yes [provider]  Semaglutide,0.25 or 0.5MG /DOS, (OZEMPIC, 0.25 OR 0.5 MG/DOSE,) 2 MG/1.5ML SOPN Inject 0.25 mg into the skin every Sunday.   Yes [provider]  sertraline (ZOLOFT) 50 MG tablet Take 75 mg by mouth daily. 05/31/15  Yes [provider]  terazosin (HYTRIN) 5 MG capsule Take 5 mg by mouth  daily. 05/24/15  Yes [provider]  EPINEPHrine 0.3 mg/0.3 mL IJ SOAJ injection Inject 0.3 mLs (0.3 mg total) into the muscle as needed for up to 1 day for anaphylaxis. 03/03/20 03/04/20  Janeece Fitting, PA-C  metoprolol tartrate (LOPRESSOR) 50 MG tablet Take 1 tablet (50 mg total) by mouth once for 1 dose. TAKE ONE HOUR PRIOR TO  SCHEDULE CARDAIC TEST Patient not taking: Reported on 03/03/2020 06/29/17 03/03/20  Leonie Man, MD  predniSONE (DELTASONE) 20 MG tablet Take 2 tablets (40 mg total) by mouth daily for 5 days. 03/03/20 03/08/20  Janeece Fitting, PA-C    Allergies    Bee venom and Remicade [infliximab]  Review of Systems   Review of Systems  Constitutional: Negative for fever.  HENT: Negative for sore throat.   Respiratory: Negative for shortness of breath.   Cardiovascular: Negative for chest pain.  Gastrointestinal: Positive for nausea. Negative for abdominal pain.  Genitourinary: Negative for flank pain.  Musculoskeletal: Negative for back pain.  Skin: Positive for rash.  Neurological: Positive for dizziness. Negative for light-headedness and headaches.  All other systems reviewed and are negative.   Physical Exam Updated Vital Signs BP 116/75   Pulse 79   Resp (!) 23   Ht 5\' 9"  (1.610 m)   Wt 116.6 kg   SpO2 93%   BMI 37.95 kg/m   Physical Exam Vitals and nursing note reviewed.  Constitutional:      Appearance: Normal appearance. He is ill-appearing.  HENT:     Head: Normocephalic and atraumatic.     Nose: Nose normal.     Mouth/Throat:     Mouth: Mucous membranes are dry.  Eyes:     Pupils: Pupils are equal, round, and reactive to light.  Cardiovascular:     Rate and Rhythm: Normal rate.     Pulses:          Radial pulses are 2+ on the right side and 2+ on the left side.  Pulmonary:     Effort: Pulmonary effort is normal.     Breath sounds: No wheezing or rales.  Abdominal:     General: Abdomen is flat.     Tenderness: There is no abdominal  tenderness. There is no right CVA tenderness or left CVA tenderness.  Musculoskeletal:     Cervical back: Normal range of motion and neck supple.     Right lower leg: No edema.     Left lower leg: No edema.  Skin:    General: Skin is warm.  Neurological:     Mental Status: He is alert and oriented to person, place, and time.  ED Results / Procedures / Treatments   Labs (all labs ordered are listed, but only abnormal results are displayed) Labs Reviewed  CBC WITH DIFFERENTIAL/PLATELET - Abnormal; Notable for the following components:      Result Value   WBC 12.1 (*)    Platelets 143 (*)    Neutro Abs 8.5 (*)    All other components within normal limits  COMPREHENSIVE METABOLIC PANEL - Abnormal; Notable for the following components:   CO2 16 (*)    Glucose, Bld 260 (*)    Creatinine, Ser 1.34 (*)    Total Protein 5.8 (*)    Albumin 3.4 (*)    AST 45 (*)    Total Bilirubin 1.5 (*)    GFR calc non Af Amer 53 (*)    Anion gap 20 (*)    All other components within normal limits  LIPASE, BLOOD - Abnormal; Notable for the following components:   Lipase 56 (*)    All other components within normal limits    EKG EKG Interpretation  Date/Time:  Wednesday March 03 2020 17:01:34 EDT Ventricular Rate:  81 PR Interval:    QRS Duration: 129 QT Interval:  411 QTC Calculation: 478 R Axis:   -59 Text Interpretation: Sinus rhythm RBBB and LAFB No significant change since last tracing Confirmed by Wandra Arthurs [phone]) on 03/03/2020 7:48:10 PM   Radiology No results found.  Procedures Procedures (including critical care time)  Medications Ordered in ED Medications  methylPREDNISolone sodium succinate (SOLU-MEDROL) 125 mg/2 mL injection 125 mg (125 mg Intravenous Given 03/03/20 1715)  famotidine (PEPCID) IVPB 20 mg premix (0 mg Intravenous Stopped 03/03/20 2025)  diphenhydrAMINE (BENADRYL) injection 50 mg (50 mg Intravenous Given 03/03/20 1716)    ED Course  I have reviewed  the triage vital signs and the nursing notes.  Pertinent labs & imaging results that were available during my care of the patient were reviewed by me and considered in my medical decision making (see chart for details).    MDM Rules/Calculators/A&P   Patient with a past medical history of diabetes, RA presents to the ED via EMS status post anaphylaxis after being stung multiple times by bees or yellow jackets.  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  Patient did not have palpable radial pulses when EMS arrived, he was given epi 0.3x2 along with placed on an epi drip.  After receiving epi and 750 mL bolus pressures normalized, patient does feel improvement in symptoms.  During evaluation patient does appear shaky, feels like he is "shaking all over ", lungs are clear to auscultation, oropharynx is without any swelling noted, he is tolerating his secretions.  Heart rate in the 80s, pressure remains low, provided with a liter bolus.  We will also add Solu-Medrol, Pepcid, Benadryl to help with symptoms.  0700 PM Son at the bedside, Aaron Edelman small he was informed of all results.  We discussed that we will need to monitor his father until 90.  Patient reports improvement in symptoms after receiving Pepcid, Benadryl, Solu-Medrol.  Interpretation of his labs revealed a CBC with slight increase in leukocytosis at 12.1, suspect this is likely reactive from anaphylaxis.  CMP without any electrolyte derangement, glucose is slightly elevated prior history of diabetes.  Creatinine level with a mild AKI.  LFTs remarkable for slight elevation in AST, lipase level is also  elevated at 56.  Patient remains in stable condition, although sleepy from Benadryl.  We discussed [place].  Of 4-1/2 hours, this has been completed now at 930.  He will be picked up by son Aaron Edelman small.  Vitals remain within  normal limits, patient stable for discharge.  Portions of this note were generated with [employer]. Dictation errors may occur despite best attempts at proofreading.  Final Clinical Impression(s) / ED Diagnoses Final diagnoses:  Anaphylaxis, initial encounter    Rx / DC Orders ED Discharge Orders         Ordered    EPINEPHrine 0.3 mg/0.3 mL IJ SOAJ injection  As needed     Discontinue  Reprint     03/03/20 2118    predniSONE (DELTASONE) 20 MG tablet  Daily     Discontinue  Reprint     03/03/20 2118           Janeece Fitting, PA-C 03/03/20 2127    Drenda Freeze, MD 03/04/20 1520

## 2020-03-03 NOTE — ED Triage Notes (Signed)
Pt was working in the yard when he was stung multiple times by either bees or yellow jackets. No previous history of allergic reactions to stings. Initially for EMS no radial pulses, diaphoretic, was given two epi-pens and started on an epi drip at 6mcg minute for approximately 8 min. Pt's pressure up to 117/76 from 60s palpated initially. AoX4 on arrival, does not appear in respiratory distress

## 2020-03-23 DIAGNOSIS — M0589 Other rheumatoid arthritis with rheumatoid factor of multiple sites: Secondary | ICD-10-CM | POA: Diagnosis not present

## 2020-04-15 DIAGNOSIS — Z23 Encounter for immunization: Secondary | ICD-10-CM | POA: Diagnosis not present

## 2020-04-15 DIAGNOSIS — I10 Essential (primary) hypertension: Secondary | ICD-10-CM | POA: Diagnosis not present

## 2020-04-15 DIAGNOSIS — E1165 Type 2 diabetes mellitus with hyperglycemia: Secondary | ICD-10-CM | POA: Diagnosis not present

## 2020-04-20 DIAGNOSIS — M0589 Other rheumatoid arthritis with rheumatoid factor of multiple sites: Secondary | ICD-10-CM | POA: Diagnosis not present

## 2020-05-13 DIAGNOSIS — E1165 Type 2 diabetes mellitus with hyperglycemia: Secondary | ICD-10-CM | POA: Diagnosis not present

## 2020-05-13 DIAGNOSIS — I10 Essential (primary) hypertension: Secondary | ICD-10-CM | POA: Diagnosis not present

## 2020-05-18 DIAGNOSIS — M0589 Other rheumatoid arthritis with rheumatoid factor of multiple sites: Secondary | ICD-10-CM | POA: Diagnosis not present

## 2020-05-20 DIAGNOSIS — E1165 Type 2 diabetes mellitus with hyperglycemia: Secondary | ICD-10-CM | POA: Diagnosis not present

## 2020-06-14 ENCOUNTER — Ambulatory Visit (HOSPITAL_COMMUNITY)
Admission: EM | Admit: 2020-06-14 | Discharge: 2020-06-14 | Disposition: A | Discharge status: Home / Self Care | Payer: Medicare Other | Attending: Family Medicine | Admitting: Family Medicine

## 2020-06-14 ENCOUNTER — Encounter (HOSPITAL_COMMUNITY): Payer: Self-pay

## 2020-06-14 ENCOUNTER — Other Ambulatory Visit: Payer: Self-pay

## 2020-06-14 DIAGNOSIS — S0031XA Abrasion of nose, initial encounter: Secondary | ICD-10-CM

## 2020-06-14 MED ORDER — MUPIROCIN 2 % EX OINT: 1.0000 "application " | TOPICAL_OINTMENT | Freq: Two times a day (BID) | CUTANEOUS | 0 refills | Status: AC

## 2020-06-14 NOTE — Discharge Instructions (Signed)
Apply ointment 2 x a day

## 2020-06-14 NOTE — ED Triage Notes (Signed)
Pt presents with issue with right  Nostril for unknown amount of time.

## 2020-06-15 DIAGNOSIS — M0589 Other rheumatoid arthritis with rheumatoid factor of multiple sites: Secondary | ICD-10-CM | POA: Diagnosis not present

## 2020-06-15 NOTE — ED Provider Notes (Signed)
[facility]    CSN: [id number] Arrival date & time: 06/14/20  1745      History   Chief Complaint Chief Complaint  Patient presents with  . Nose Issue    HPI Collin King is a 69 y.o. male.   HPI  Inside nostrils sore since starting  CPAP with nasal pillows No bleeding   Past Medical History:  Diagnosis Date  . Arthritis   . Diabetes mellitus, type II ([place]) 05/2017   A1c 7.3  . Gout   . History of sleep apnea 2004   Status post surgery  . Hypertension   . Rheumatoid arthritis involving both knees [facility])     Patient Active Problem List   Diagnosis Date Noted  . Dyspnea on exertion 06/29/2017  . Edema of both legs 06/29/2017  . Family history of premature coronary artery disease 06/29/2017  . OSA (obstructive sleep apnea) 06/29/2017  . Diabetes mellitus type II, non insulin dependent ([place]) 06/29/2017  . Essential hypertension 06/29/2017  . Morbid obesity ([place]) 06/29/2017  . Chest pain on breathing 06/29/2017    Past Surgical History:  Procedure Laterality Date  . HERNIA REPAIR    . Sleep apnea surgery  2004  . THORACOSCOPY W/ THORACIC SYMPATHECTOMY     T2-T3  . WRIST SURGERY  1977   Status post prosthesis       Home Medications    Prior to Admission medications   Medication Sig Start Date End Date Taking? Authorizing Provider  Abatacept (ORENCIA IV) Inject into the vein every 30 (thirty) days.    [provider]  alfuzosin (UROXATRAL) 10 MG 24 hr tablet Take 10 mg by mouth daily. 05/31/15   [provider]  allopurinol (ZYLOPRIM) 100 MG tablet Take 200 mg by mouth in the morning.  06/01/15   [provider]  hydrochlorothiazide (HYDRODIURIL) 12.5 MG tablet Take 12.5 mg by mouth daily. 05/31/15   [provider]  HYDROcodone-acetaminophen (NORCO/VICODIN) 5-325 MG tablet Take 1 tablet by mouth 2 (two) times daily as needed (for pain).    [provider]  ibuprofen (ADVIL) 800 MG tablet Take 800 mg  by mouth daily as needed for headache or mild pain.     [provider]  metFORMIN (GLUCOPHAGE) 500 MG tablet Take 1,000 mg by mouth 2 (two) times daily with a meal.    [provider]  metoprolol tartrate (LOPRESSOR) 50 MG tablet Take 1 tablet (50 mg total) by mouth once for 1 dose. TAKE ONE HOUR PRIOR TO  SCHEDULE CARDAIC TEST Patient not taking: Reported on 03/03/2020 06/29/17 03/03/20  Leonie Man, MD  mupirocin ointment (BACTROBAN) 2 % Apply 1 application topically 2 (two) times daily. 06/14/20   Raylene Everts, MD  Semaglutide,0.25 or 0.5MG /DOS, (OZEMPIC, 0.25 OR 0.5 MG/DOSE,) 2 MG/1.5ML SOPN Inject 0.25 mg into the skin every Sunday.    [provider]  sertraline (ZOLOFT) 50 MG tablet Take 75 mg by mouth daily. 05/31/15   [provider]  terazosin (HYTRIN) 5 MG capsule Take 5 mg by mouth daily. 05/24/15   [provider]    Family History Family History  Problem Relation Age of Onset  . Diabetes Mellitus II Father   . Heart attack Father        Total of 3 heart attacks.  Marland Kitchen CAD Father        With ischemic cardia myopathy  . Coronary artery disease Paternal Uncle  43 out of 16 paternal aunts and has had CAD    Social History Social History   Tobacco Use  . Smoking status: Never Smoker  . Smokeless tobacco: Never Used  Substance Use Topics  . Alcohol use: No  . Drug use: No     Allergies   Bee venom and Remicade [infliximab]   Review of Systems Review of Systems See HPI  Physical Exam Triage Vital Signs ED Triage Vitals  Enc Vitals Group     BP 06/14/20 1914 118/68     Pulse Rate 06/14/20 1914 (!) 58     Resp 06/14/20 1914 20     Temp 06/14/20 1914 98.7 F (37.1 C)     Temp Source 06/14/20 1914 Oral     SpO2 06/14/20 1914 95 %     Weight --      Height --      Head Circumference --      Peak Flow --      Pain Score 06/14/20 1912 4     Pain Loc --      Pain Edu? --      Excl. in [place]? --    No  data found.  Updated Vital Signs BP 118/68 (BP Location: Right Arm)   Pulse (!) 58   Temp 98.7 F (37.1 C) (Oral)   Resp 20   SpO2 95%     Physical Exam Constitutional:      General: He is not in acute distress.    Appearance: He is well-developed.  HENT:     Head: Normocephalic and atraumatic.     Nose:     Comments: Mild irritation nasal septum on spec exam Eyes:     Conjunctiva/sclera: Conjunctivae normal.     Pupils: Pupils are equal, round, and reactive to light.  Cardiovascular:     Rate and Rhythm: Normal rate.  Pulmonary:     Effort: Pulmonary effort is normal. No respiratory distress.  Abdominal:     General: There is no distension.     Palpations: Abdomen is soft.  Musculoskeletal:        General: Normal range of motion.     Cervical back: Normal range of motion.  Skin:    General: Skin is warm and dry.  Neurological:     Mental Status: He is alert.      UC Treatments / Results  Labs (all labs ordered are listed, but only abnormal results are displayed) Labs Reviewed - No data to display  EKG   Radiology No results found.  Procedures Procedures (including critical care time)  Medications Ordered in UC Medications - No data to display  Initial Impression / Assessment and Plan / UC Course  I have reviewed the triage vital signs and the nursing notes.  Pertinent labs & imaging results that were available during my care of the patient were reviewed by me and considered in my medical decision making (see chart for details).     Final Clinical Impressions(s) / UC Diagnoses   Final diagnoses:  Abrasion of nose, initial encounter     Discharge Instructions     Apply ointment 2 x a day    ED Prescriptions    Medication Sig Dispense Auth. Provider   mupirocin ointment (BACTROBAN) 2 % Apply 1 application topically 2 (two) times daily. 22 g Raylene Everts, MD     PDMP not reviewed this encounter.   Raylene Everts, MD 06/15/20  [phone]

## 2020-07-13 DIAGNOSIS — M0589 Other rheumatoid arthritis with rheumatoid factor of multiple sites: Secondary | ICD-10-CM | POA: Diagnosis not present

## 2020-08-11 DIAGNOSIS — M150 Primary generalized (osteo)arthritis: Secondary | ICD-10-CM | POA: Diagnosis not present

## 2020-08-11 DIAGNOSIS — M1A09X0 Idiopathic chronic gout, multiple sites, without tophus (tophi): Secondary | ICD-10-CM | POA: Diagnosis not present

## 2020-08-11 DIAGNOSIS — Z79899 Other long term (current) drug therapy: Secondary | ICD-10-CM | POA: Diagnosis not present

## 2020-08-11 DIAGNOSIS — E669 Obesity, unspecified: Secondary | ICD-10-CM | POA: Diagnosis not present

## 2020-08-11 DIAGNOSIS — M25512 Pain in left shoulder: Secondary | ICD-10-CM | POA: Diagnosis not present

## 2020-08-11 DIAGNOSIS — Z6838 Body mass index (BMI) 38.0-38.9, adult: Secondary | ICD-10-CM | POA: Diagnosis not present

## 2020-08-11 DIAGNOSIS — M5136 Other intervertebral disc degeneration, lumbar region: Secondary | ICD-10-CM | POA: Diagnosis not present

## 2020-08-11 DIAGNOSIS — M0589 Other rheumatoid arthritis with rheumatoid factor of multiple sites: Secondary | ICD-10-CM | POA: Diagnosis not present

## 2020-08-11 DIAGNOSIS — M25511 Pain in right shoulder: Secondary | ICD-10-CM | POA: Diagnosis not present

## 2020-08-12 DIAGNOSIS — M0589 Other rheumatoid arthritis with rheumatoid factor of multiple sites: Secondary | ICD-10-CM | POA: Diagnosis not present

## 2020-09-02 DIAGNOSIS — E1165 Type 2 diabetes mellitus with hyperglycemia: Secondary | ICD-10-CM | POA: Diagnosis not present

## 2020-09-09 DIAGNOSIS — E1165 Type 2 diabetes mellitus with hyperglycemia: Secondary | ICD-10-CM | POA: Diagnosis not present

## 2020-09-09 DIAGNOSIS — G4733 Obstructive sleep apnea (adult) (pediatric): Secondary | ICD-10-CM | POA: Diagnosis not present

## 2020-09-09 DIAGNOSIS — I10 Essential (primary) hypertension: Secondary | ICD-10-CM | POA: Diagnosis not present

## 2020-09-09 DIAGNOSIS — M0589 Other rheumatoid arthritis with rheumatoid factor of multiple sites: Secondary | ICD-10-CM | POA: Diagnosis not present

## 2020-10-26 DIAGNOSIS — M159 Polyosteoarthritis, unspecified: Secondary | ICD-10-CM | POA: Diagnosis not present

## 2020-10-26 DIAGNOSIS — M0589 Other rheumatoid arthritis with rheumatoid factor of multiple sites: Secondary | ICD-10-CM | POA: Diagnosis not present

## 2020-10-26 DIAGNOSIS — M1A00X0 Idiopathic chronic gout, unspecified site, without tophus (tophi): Secondary | ICD-10-CM | POA: Diagnosis not present

## 2020-10-28 DIAGNOSIS — M0589 Other rheumatoid arthritis with rheumatoid factor of multiple sites: Secondary | ICD-10-CM | POA: Diagnosis not present
